# Patient Record
Sex: Female | Born: 1935 | Race: White | Hispanic: No | Smoking: Never smoker
Health system: Southern US, Community
[De-identification: ages and names within clinical notes are randomized; demographics above are authoritative.]

## PROBLEM LIST (undated history)

## (undated) DIAGNOSIS — N200 Calculus of kidney: Secondary | ICD-10-CM

## (undated) DIAGNOSIS — I10 Essential (primary) hypertension: Secondary | ICD-10-CM

## (undated) DIAGNOSIS — R739 Hyperglycemia, unspecified: Secondary | ICD-10-CM

## (undated) DIAGNOSIS — I509 Heart failure, unspecified: Secondary | ICD-10-CM

## (undated) DIAGNOSIS — I4819 Other persistent atrial fibrillation: Secondary | ICD-10-CM

---

## 2005-12-26 ENCOUNTER — Inpatient Hospital Stay (HOSPITAL_COMMUNITY): Admission: RE | Admit: 2005-12-26 | Discharge: 2006-01-23 | Payer: Self-pay | Admitting: General Surgery

## 2005-12-26 ENCOUNTER — Encounter (INDEPENDENT_AMBULATORY_CARE_PROVIDER_SITE_OTHER): Payer: Self-pay | Admitting: Specialist

## 2006-01-26 ENCOUNTER — Inpatient Hospital Stay (HOSPITAL_COMMUNITY): Admission: EM | Admit: 2006-01-26 | Discharge: 2006-02-05 | Payer: Self-pay | Admitting: *Deleted

## 2006-03-17 ENCOUNTER — Ambulatory Visit (HOSPITAL_COMMUNITY): Admission: RE | Admit: 2006-03-17 | Discharge: 2006-03-17 | Payer: Self-pay | Admitting: General Surgery

## 2006-06-09 IMAGING — CR DG ABDOMEN ACUTE W/ 1V CHEST
4 series · 4 of 4 positions shown · non-contrast
Comparison: none

CLINICAL DATA: Abdominal pain and nausea.  Reportedly status post laparoscopic Nissen fundoplication.  Has a drain.
ACUTE ABDOMINAL SERIES WITH CHEST ? 4 VIEWS ? 01/26/06:

[w chest pa]
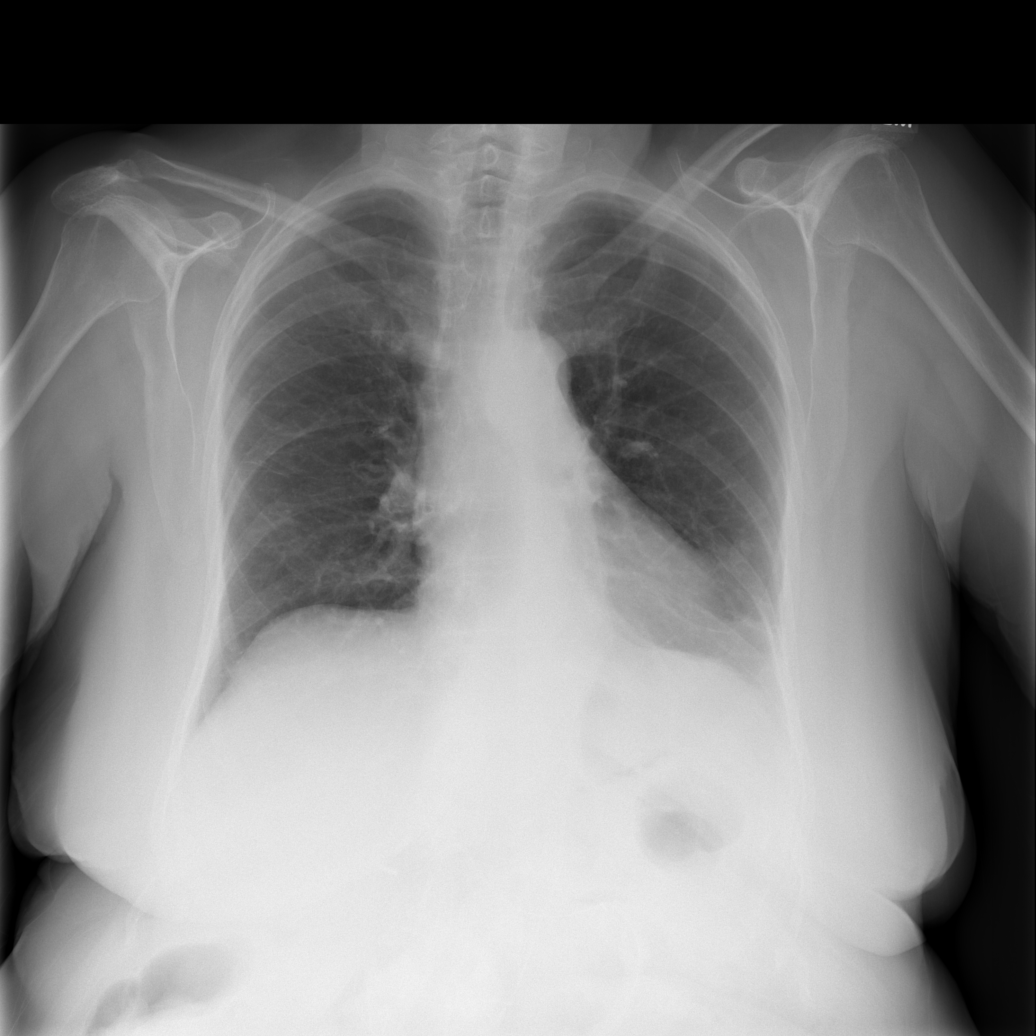

[w abdomen upright *]
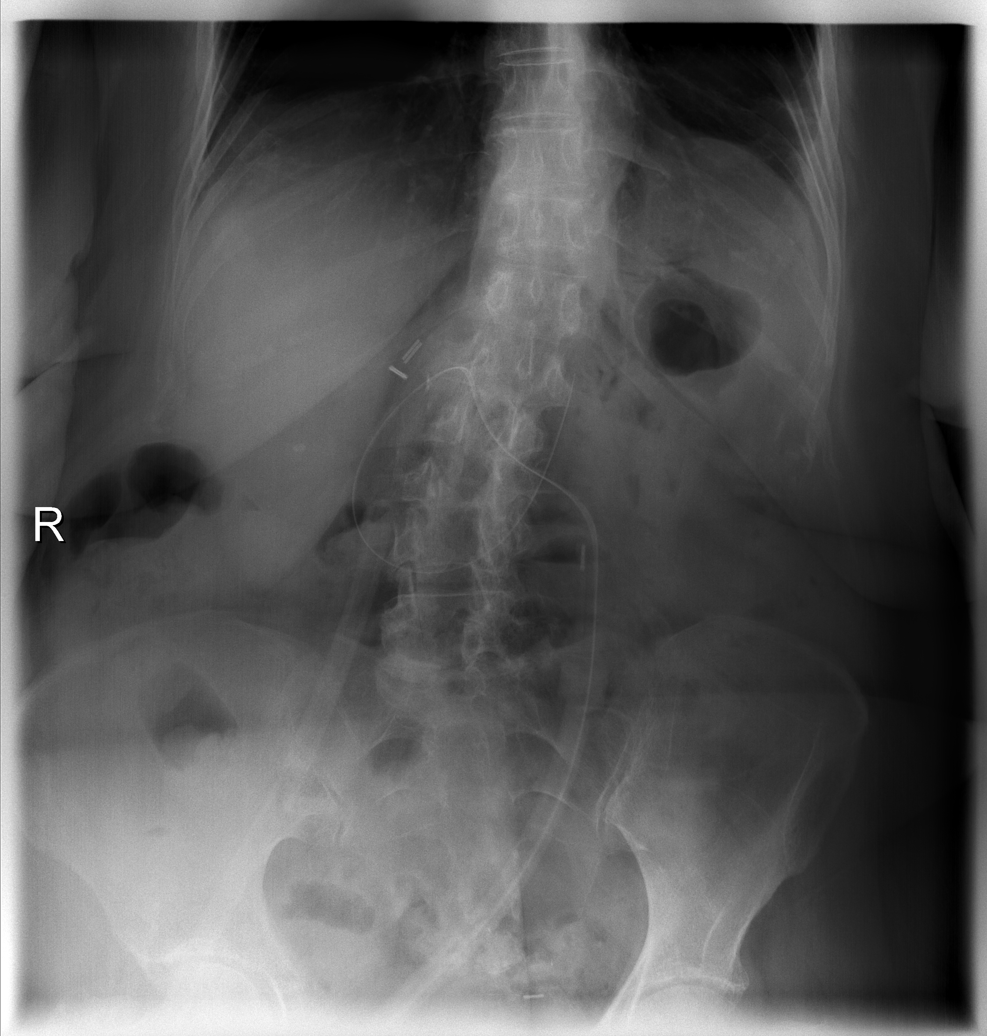

[t abdomen supine (1 of 2)]
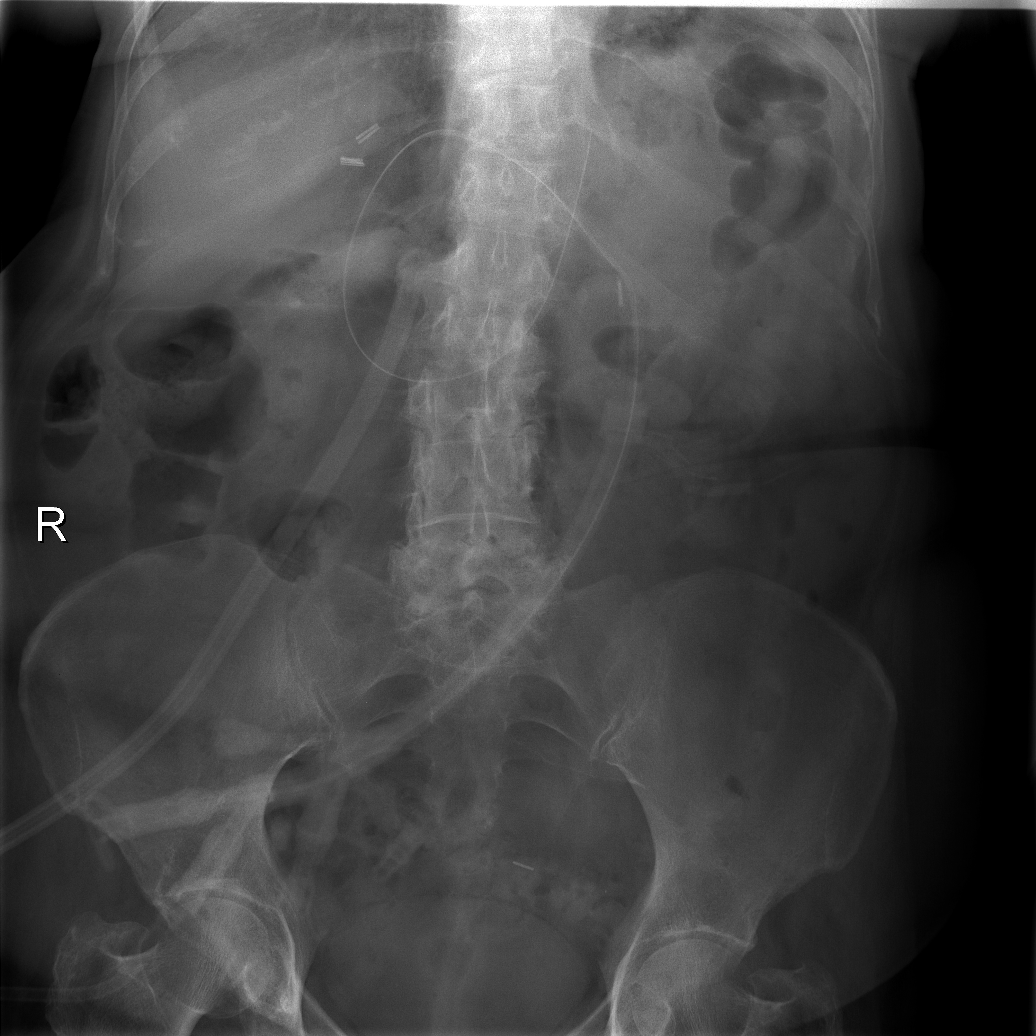

[t abdomen supine (2 of 2)]
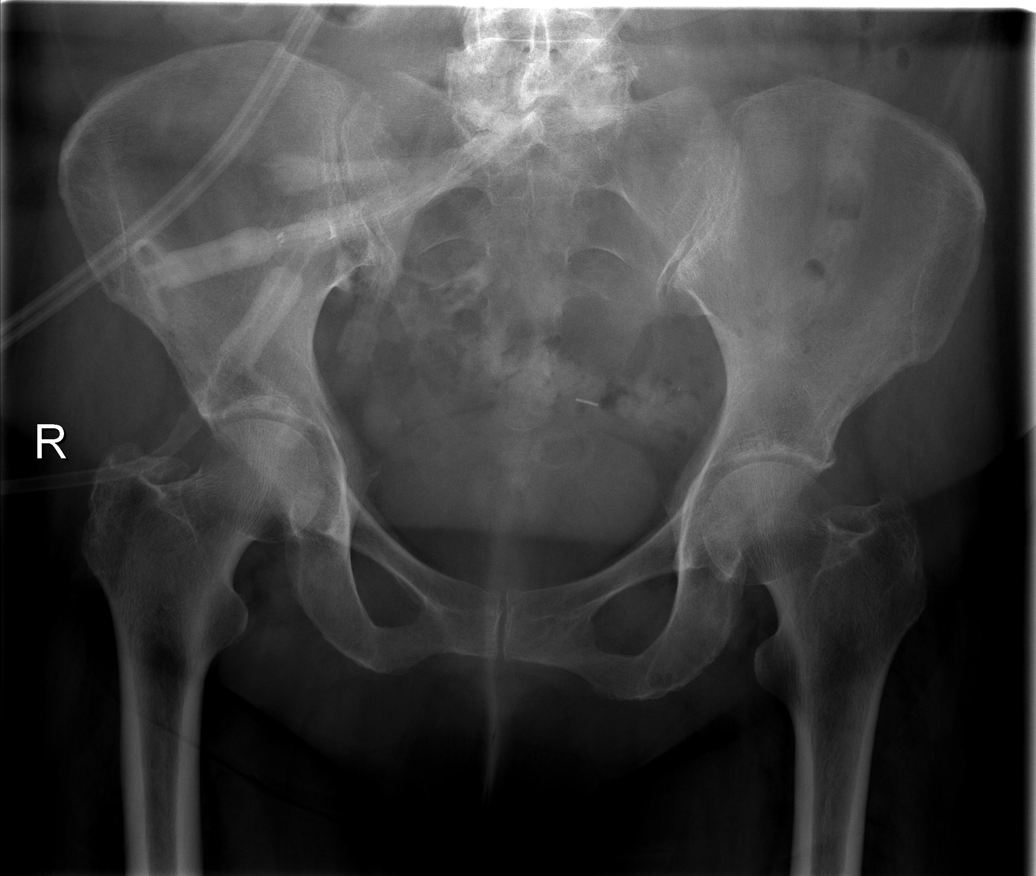

[4 of 4 positions shown; findings below may reference images not displayed]

FINDINGS: There is minimal subsegmental atelectasis versus scarring at the left base.  There is soft tissue prominence just above the diaphragm and may represent the fundoplication.  The bowel gas pattern is unremarkable.  No free intraperitoneal air.  A radiopaque drain is noted in the epigastrium.  There are metallic clips in the medial right upper quadrant and in the pelvis.  A right kidney stone is noted.
IMPRESSION: Unremarkable for acute chest or abdominal disease.

## 2006-06-11 IMAGING — XA IR PERC PLACEMENT GASTROSTOMY
1 series · 5 of 5 positions shown · non-contrast
Comparison: none

CLINICAL DATA: 78 year-old female, hypokalemia and long term gastric and jejunal tube feeds. There is persistent leakage of gastric contents at the gastrostomy skin site.  The jejunal tube is also coiled in the stomach.
PERCUTANEOUS EXCHANGE OF A GASTROJEJUNOSTOMY FEEDING TUBE:
Radiologist:  Dr. Ares.
Guidance:  Fluoroscopic.
Complications:  No immediate complications.
Procedure/Findings: The existing 22 French gastrostomy and the inner conversion GJ tube were both removed over a glidewire.  Access was remanipulated into the duodenum with a Kumpe catheter and a stiff glidewire.  Over the glidewire access, a new 26 French gastrojejunostomy feeding tube was advanced with the jejunal port only into the third portion of the duodenum.  This could not be advanced further because of the patient?s stomach redundancy.  Contrast injection confirms both the gastric and duodenal positions of the GJ tube.  The catheter balloon tip was inflated with 18 cc of saline and retracted against the abdominal wall.

[Series 1000: run · 5 of 5 slices shown]
[im 1/5]
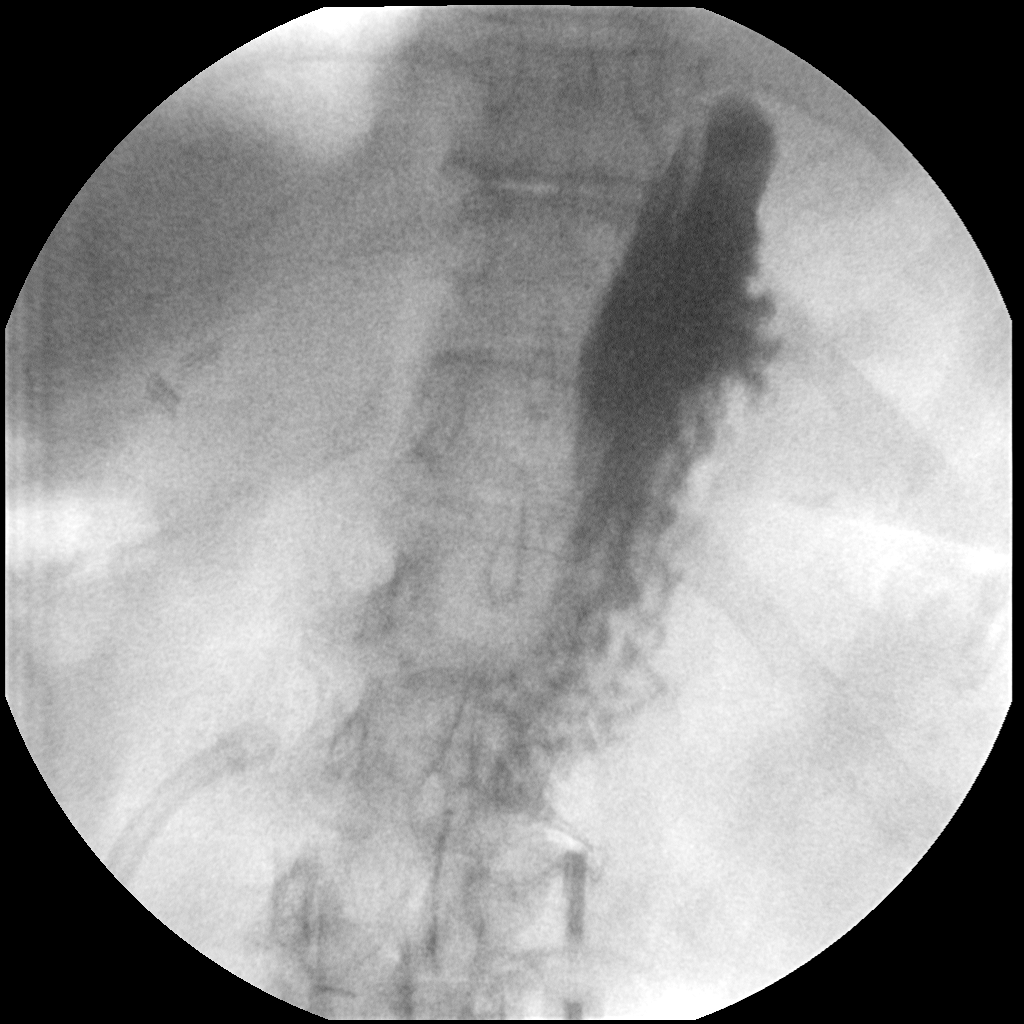
[im 2/5]
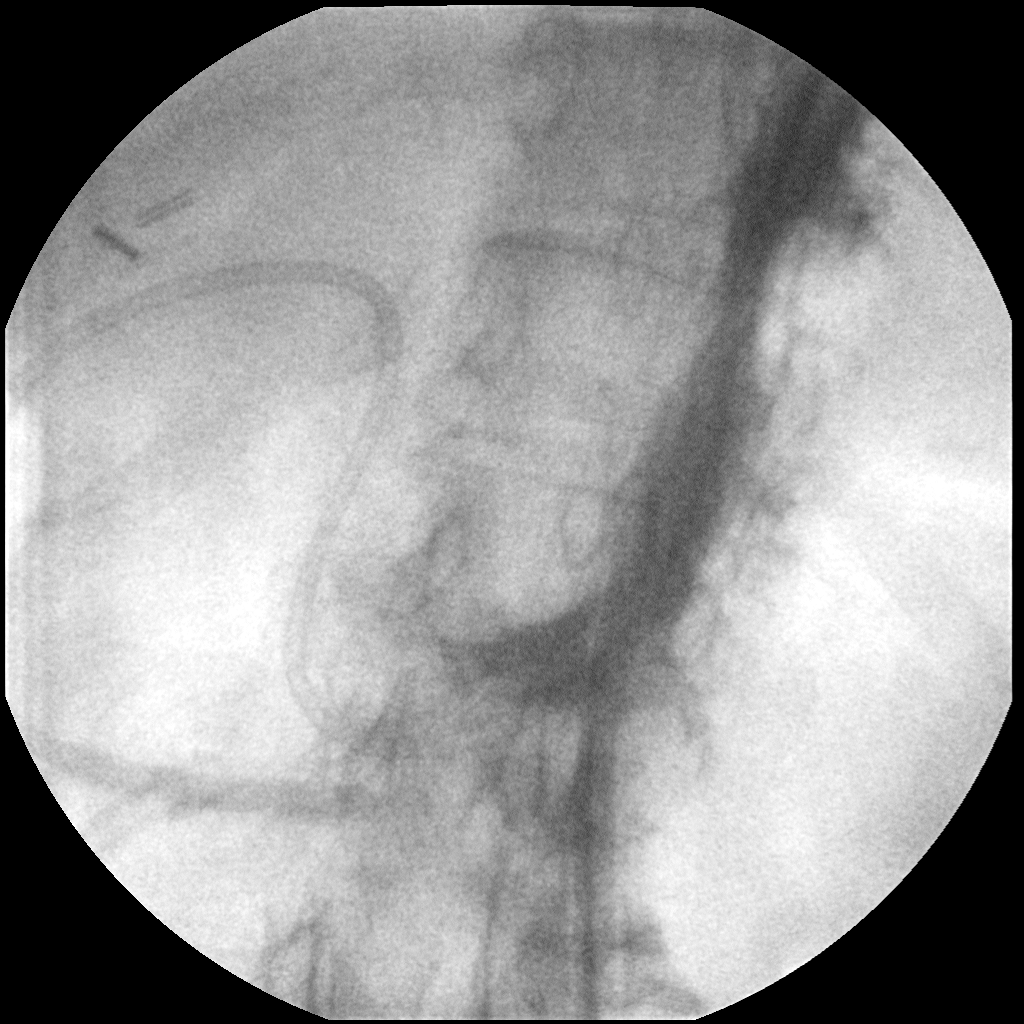
[im 3/5]
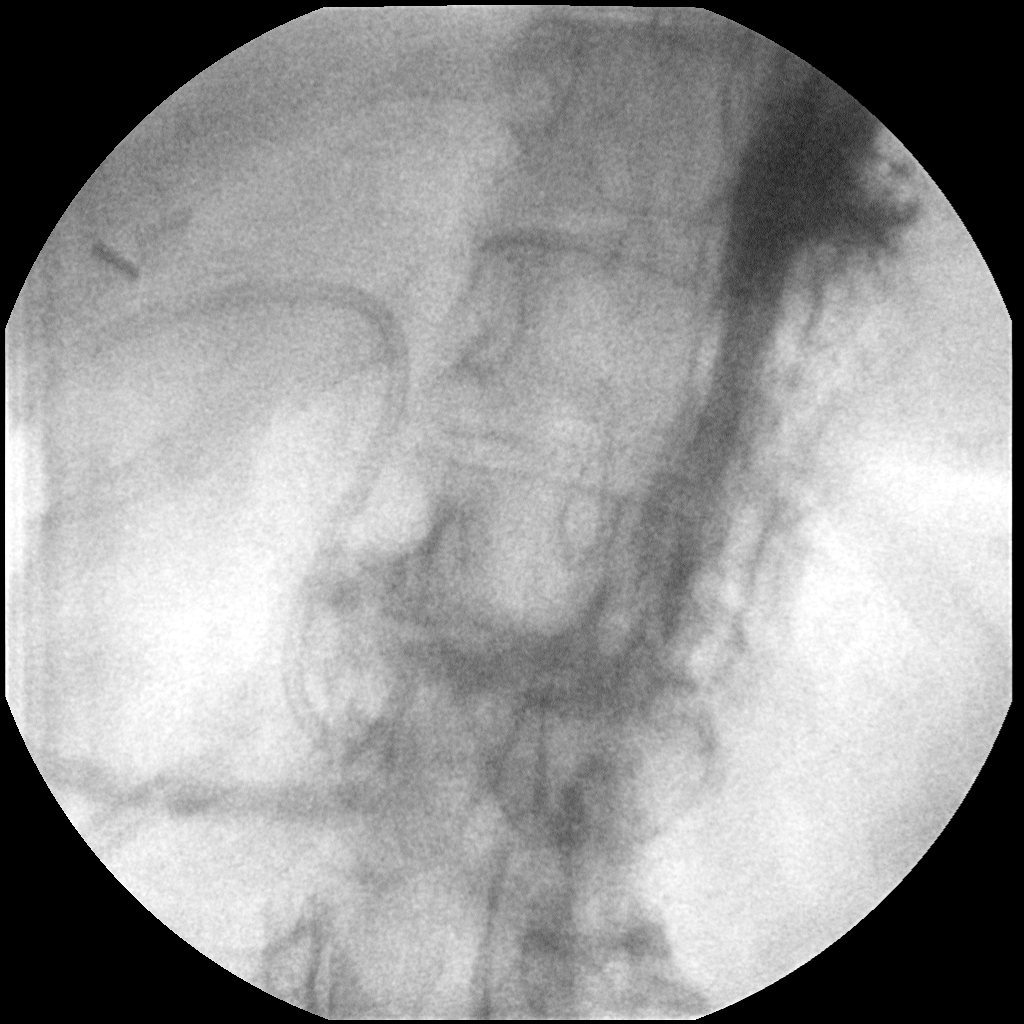
[im 4/5]
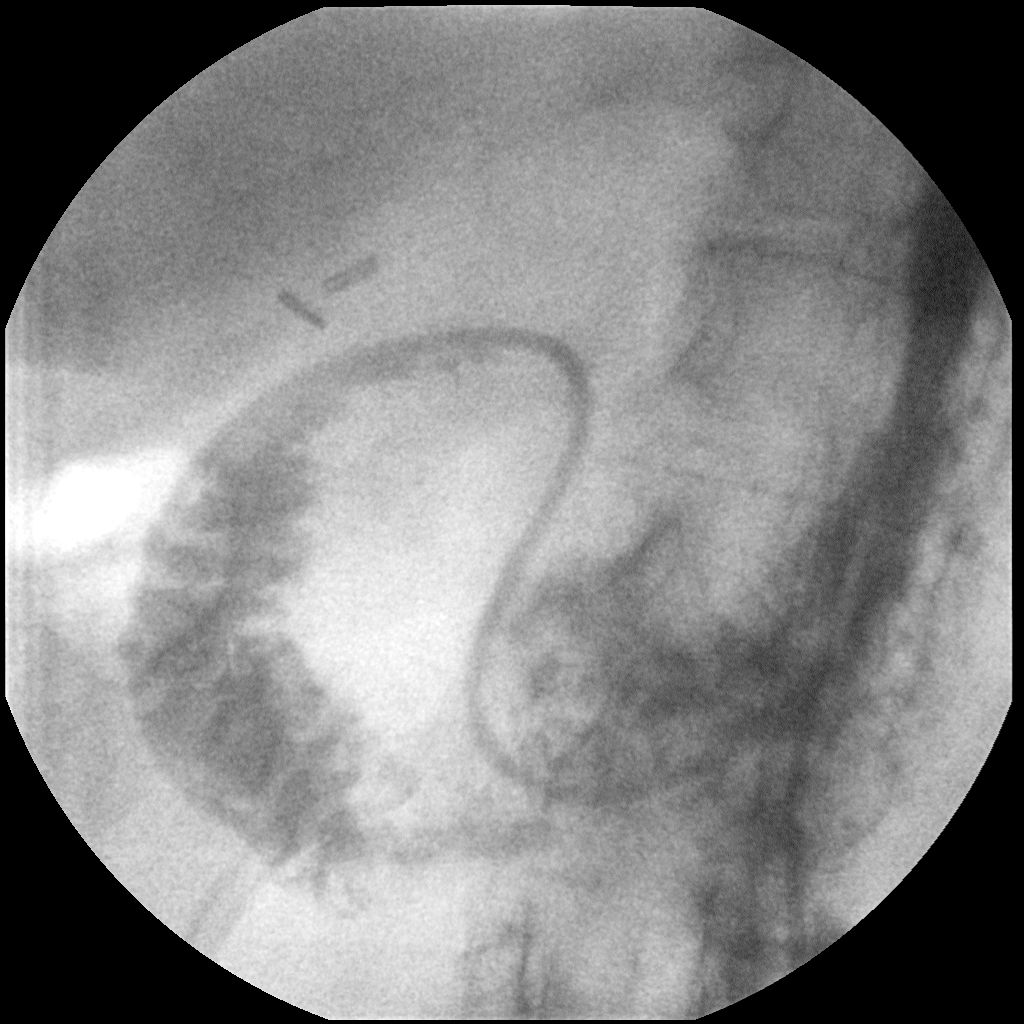
[im 5/5]
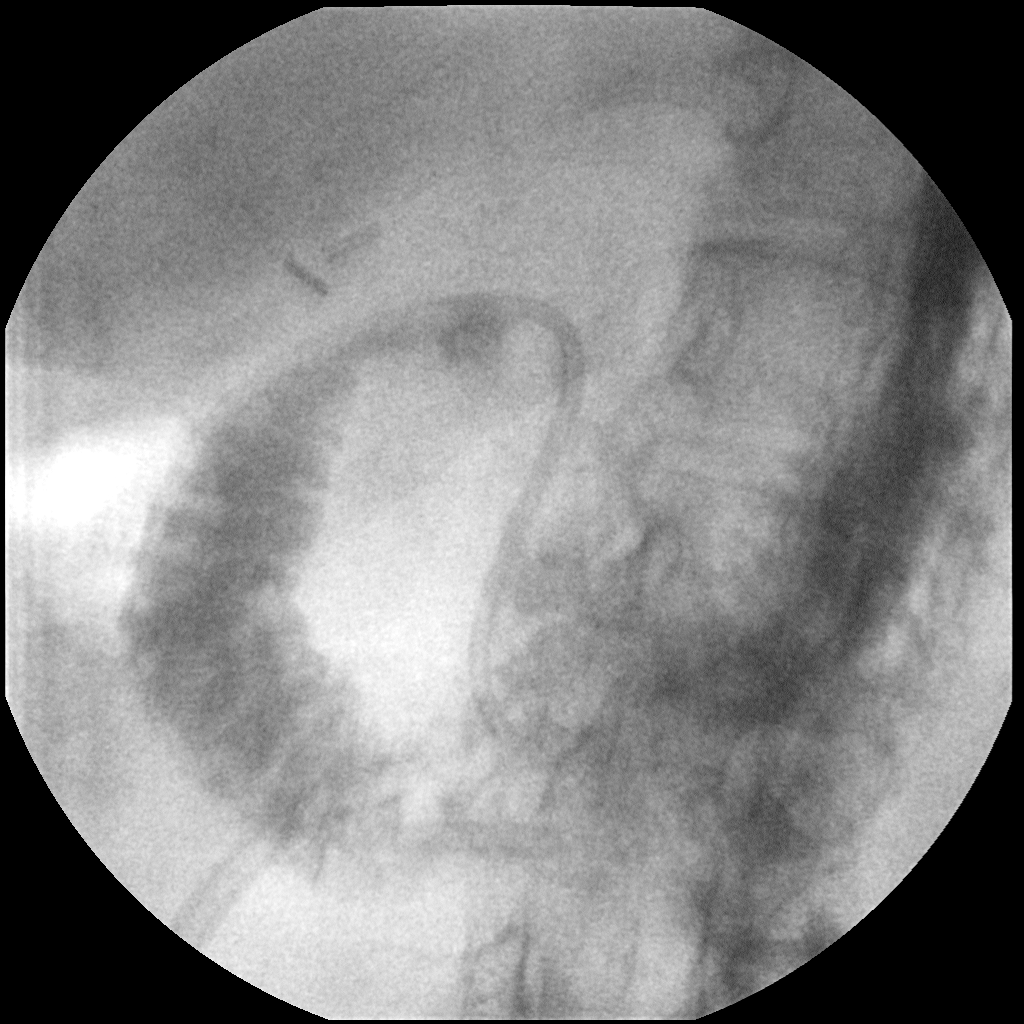

[5 of 5 positions shown; findings below may reference images not displayed]

IMPRESSION: Exchange and upsize of the GJ tube to a new 26 French gastrojejunostomy tube. The jejunal port could only be advanced as far as the third portion of the duodenum related to stomach redundancy.

## 2011-04-05 NOTE — Discharge Summary (Signed)
Samantha [name], Samantha [name]                  ACCOUNT NO.:  [id number]   MEDICAL RECORD NO.:  [id number]          PATIENT TYPE:  INP   LOCATION:  1619                         FACILITY:  [facility]   PHYSICIAN:  Adolph Pollack, M.D.DATE OF BIRTH:  [birth year]   DATE OF ADMISSION:  12/26/2005  DATE OF DISCHARGE:  01/23/2006                                 DISCHARGE SUMMARY   PRINCIPAL DISCHARGE DIAGNOSES:  1.  Type 4 hiatal hernia.  2.  Early recurrent hiatal hernia secondary to suture disruption.  3.  Hypertension.  4.  Delayed gastric emptying.  5.  Hypovolemia.  6.  Malnutrition.  7.  Blood loss anemia.  8.  Hypokalemia.  9.  Wound infection.  10. Urinary retention.  11. Abdominal wall rash.  12. Deconditioned state.  13. Open wound.   REASON FOR ADMISSION:  Ms. Wellman is a 77 year old female who has had a large  hiatal hernia, she states, for 17 years.  She had become symptomatic with it  and had what appeared to be a transient volvulus/obstruction.  She was  subsequently admitted to the hospital for repair.   HOSPITAL COURSE:  She underwent a laparoscopic hiatal hernia repair and  Nissen fundoplication on December 26, 2005.  I did an upper GI series to  check the status of the repair on December 27, 2005 and it demonstrated still  some intrathoracic stomach with slower emptying.  She, subsequently, was in  no acute distress and taken back to the operating room on December 28, 2005  where I did a laparoscopic converted to an open procedure, and there was  noted to have a disruption of 2 of the sutures.  The wrap had also appeared  to have slid down in the stomach.  She did not have an angle of His, so it  was very difficult to identify the gastroesophageal junction.  Intraoperatively, at that time, I performed an upper endoscopy that allowed  me to identify the GE junction.  She had a shortened esophagus.  I was able  to get the distal esophagus back into abdominal cavity and then  reclosed the  hiatus.  I was not able to perform a fundoplication, however, because not  enough esophagus was in the abdominal cavity.  I subsequently performed a  gastrostomy at that time.  Postoperatively, the second time,  the  gastrostomy was kept on drainage.  She had some postoperative acute blood  loss anemia and this stabilized out fairly well.  Her hemoglobin went down  to 9.8 by her fourth overall postoperative day and then 9.4 on the postop  day 7.  She had some difficulty with hypokalemia and this had to be  corrected.  I try to clamp the G tube  and started venting it every 6 hours  waiting for the ileus to resolve.  We continued to have to aggressively  treat her hypokalemia.  She had more and more drainage around her G tube and  that is when we noticed that the wound was red, and I opened this up and  there was gastric-type  drainage.  It appeared that the drainage had backed  up around the G tube and then gone into the wound.  I went ahead and hooked  a G tube up to gravity, had 400 mL of drainage immediately and this was with  her eighth postoperative day.  I went ahead and did a gastrostomy tube  injection on that day, which showed good position of the tube without  obstruction, but there was significant reflux and essentially very poor if  any emptying.  Reglan was started.   Hypokalemia persisted and we continued to treat this aggressively.  She was  started on TNA through a central line.  We started dressing changes and  continue to clean the wound.  She was on intravenous antibiotics (Zosyn).  I  left the Foley in.  I repeated an upper GI on January 07, 2006, which again  showed minimal to no gastric emptying.  We continued the G tube to drainage.  We continued to Reglan, antibiotics and wound care.  I started her on a  Procrit protocol for her blood loss anemia.  She then had a very slow, but  progressive postoperative course.  She was given to p.r.n.  Valium as  she  began having a restless leg type syndrome.  I tried her on a little bit of a  full liquid diet.  However, she did not tolerate this well and had  increasing G tube outputs, thus continued the TNA and placed the G tube back  to gravity drainage.  Eventually, I removed the G tube and converted to a  gastrostomy/jejunostomy tube with a tip of the jejunostomy portionto the  ligament of Treitz.  Enteral feeds were started.  A wound VAC was placed.  I  started slow and enteral feeds, and she tolerated things fairly well.  We  added free water and then started on some G tube clamping trials, and  unclamped it about every 4-6 hours as needed.  She did have some diarrhea  and some nausea, and thus we decreased the tube feeding rate and she  tolerated this better.  We slowly were able to increase the right idea in  and that she was able to tolerate this better.  She had a wound VAC on and  was tolerating this well.  She was tolerating her tube feedings.  The G tube  was putting about 300 mL a shift with intermittent drainage.  Bowels were  moving, passing some gas and we felt she was able to be discharged on January 23, 2006.   DISPOSITION:  Discharged to home on January 23, 2006.  She has a wound VAC on.  She will have home health nursing arrange to help her with her tube  feedings.  She will be on nocturnal tube feedings, as well as free water  replacement.  She was given a Lortab elixir to place through the jejunostomy  port for pain, as well as Phenergan suppositories and a Protonix suspension.  She is going to follow up in my office with me in 1 week.      Adolph Pollack, M.D.  Electronically Signed     TJR/MEDQ  D:  02/24/2006  T:  02/24/2006  Job:  [id number]

## 2011-04-05 NOTE — Op Note (Signed)
NAMEDOTSIE, GILLETTE                  ACCOUNT NO.:  [id number]   MEDICAL RECORD NO.:  [id number]          PATIENT TYPE:  INP   LOCATION:  1619                         FACILITY:  [facility]   PHYSICIAN:  Adolph Pollack, M.D.DATE OF BIRTH:  02/18/1927   DATE OF PROCEDURE:  12/28/2005  DATE OF DISCHARGE:                                 OPERATIVE REPORT   PREOPERATIVE DIAGNOSIS:  Recurrent hiatal hernia.   POSTOPERATIVE DIAGNOSIS:  Recurrent hiatal hernia.   PROCEDURE:  Repair of recurrent hiatal hernia and gastrostomy.   SURGEON:  Dr. Abbey Chatters.   ASSISTANT:  Dr. Lebron Conners and Dr. Cyndia Bent   ANESTHESIA:  General.   INDICATIONS:  This 77 year old female underwent a laparoscopic repair of a  type 4 hiatal hernia with a Nissen fundoplication 2 days ago.  Routine  postop day #1, upper GI series was suggestive of a recurrent herniation and  confirmed by CT scan as well as potential slippage of the fundoplication.  She was doing fairly well, but she is brought to the operating room today  for repair of recurrent hernia.   TECHNIQUE:  She is brought to the operating room, placed supine on the  operating table, and general anesthetic was administered.  An oral gastric  tube was placed, and the abdominal wall was sterilely prepped and draped.  Dressings had been removed, and the previous supraumbilical incision was  opened up and the fascial stitch removed.  A Hassan trocar was introduced  into the peritoneal cavity and pneumoperitoneum created by insufflation of  CO2 gas.  Under laparoscopic vision, I replaced the right upper quadrant  trocars and the left upper quadrant trocar, and 10/11 mm trocars were  inserted into the abdominal cavity.  Liver retractor was placed through a  previous 5 mm incision in the epigastrium and the left lobe of the liver  retracted anteriorly.  I was immediately was able to note that part of the  stomach had herniated through an anterior portion  of the hiatus, indicating  some disruption of the repair, as this area did not exist after the surgery  was completed 2 days ago.  I reduced the stomach.  I then examined the  crural repair and noticed that only 4 of the 6 sutures were still intact.  I  subsequently identified the fundoplication that cut the sutures and took the  wrap down.  In looking at the x-ray and looking at the stomach, I noted that  she had no angle of His that I could appreciate and, in fact, the wrap had  probably been around the proximal portion of the stomach.  She appeared at  this time to have somewhat of a short esophagus, and I could not seem to  free up the proximal stomach in the esophagus, indicating that indeed this  was a little bit of an aberrant anatomical finding.   At this time, I performed upper endoscopy to indeed identify that point.  The esophagus appeared to be a little bit dilated.  I found the  gastroesophageal junction and noted it was at the  level of the hiatus at  this time and, indeed, there was part of the stomach without the angle of  His, this basically looking like part of the esophagus.  The stomach was  more of a tubular nature proximally.  I then evacuated the air from the  stomach and removed the endoscope.   I removed the epigastric trocar and made a larger incision and put a hand  assist device in.  I introduced my left hand into the abdominal cavity,  tried to reduce some of the esophagus back in the abdominal cavity which I  could do, but this was still difficult to determine if I could mobilize more  of the stomach to bring it down off the esophagus.  Because of this, I  removed the hand assist device and converted to an open procedure by making  an upper midline incision through all layers and using a self-retaining  retractor for exposure.  I exposed the gastroesophageal junction and noticed  again that there was no angle of His.  I was able to mobilize the esophagus   maybe 1-2 cm just below the diaphragm but not enough to perform a wrap  fundoplication.  I then replaced 3 more pledgeted sutures posteriorly in the  hiatus to close it fairly tightly.  I then chose a point on the body of the  stomach to pull a gastrostomy tube and.  A stab incision was made in the  left upper quadrant, and a size 24-French Foley 5 mL balloon was inserted  into the abdominal cavity.  The balloon was tested and was intact.  Two  pursestring sutures of 2-0 silk were placed in the body of the stomach, and  then a gastrotomy was performed.  The Foley catheter was placed into the  stomach, and a pursestring suture was tied down.  A 4 quadrant gastropexy  was then performed, and this provided some inferior traction on the stomach,  keeping it from going back up toward the hiatus.  The gastrostomy tube was  anchored to the skin with 3-0 nylon suture.   The area was irrigated, and no bleeding was noted.  There was subsequently a  missing specific type of retractor.  I did go ahead and close the fascia  with a #1 PDS, and an x-ray was performed.  There is some suspicion whether  there was an inaccurate count to starting the case.  There was no evidence  of retained a retractor by way of fluoroscopy of the abdominal cavity.  The  OR staff felt that the initial starting count was likely incorrect.   Following this, the subcutaneous tissue was irrigated.  All skin incisions  were closed with staples, and sterile dressings were applied.   She tolerated the procedure without apparent complications, and she was  taken to the recovery room in satisfactory condition.      Adolph Pollack, M.D.  Electronically Signed     TJR/MEDQ  D:  12/28/2005  T:  12/30/2005  Job:  [id number]

## 2011-04-05 NOTE — Discharge Summary (Signed)
NAMEKINGA, Samantha [name]                  ACCOUNT NO.:  [id number]   MEDICAL RECORD NO.:  [id number]          PATIENT TYPE:  INP   LOCATION:  1619                         FACILITY:  [facility]   PHYSICIAN:  Adolph Pollack, M.D.DATE OF BIRTH:  12-17-1926   DATE OF ADMISSION:  01/26/2006  DATE OF DISCHARGE:  02/05/2006                                 DISCHARGE SUMMARY   PRINCIPAL DIAGNOSIS:  Dislodged gastrostomy/jejunostomy tube.   SECONDARY DIAGNOSES:  1.  Ileus.  2.  Hypokalemia contributing to the ileus.  3.  Open abdominal wound.  4.  Malnutrition.   PROCEDURE:  Replacement of gastrostomy/jejunostomy tube.   REASON FOR ADMISSION:  [name] is a 77 year old female who had a prolonged  hospital course after hiatal hernia repair. She was sent home on tube  feedings by way of a combination gastrostomy/jejunostomy. She began having a  lot of discharge out through the gastrostomy and around the gastrostomy with  nausea and vomiting and was brought to the emergency department and found to  have profound hypokalemia with a potassium of 2.4. She was subsequently  admitted.   HOSPITAL COURSE:  She was admitted and hydrated. Radiologic study  demonstrated the gastrostomy/jejunostomy tube as malpositioned. I had this  subsequently repositioned the next day. The hypokalemia was corrected and  the ileus slowly resolved. We then began to be able to start low rate  jejunostomy feedings. Bowels started to move. She had some gastrostomy  output that was greenish-yellow and eventually lightened up. She still had  intermittent hypokalemia and I started replacing her potassium through the  tube. She had an open wound and the vac was on. She did have poor gastric  emptying and this was noted from her previous admission. We got a gastric  emptying scan which showed significant delayed emptying with about 82%  residual at two hours with greater than 30% being normal. This was discussed  with her and her  family. I did briefly place her on Procrit protocol for  some anemia.   By February 05, 2006, she was tolerating the jejunostomy feedings again at  night. She did not have much G-tube output. She was tolerating potassium  through the G-tube and was able to be discharged.   DISPOSITION:  Discharged home on February 05, 2006. We will continue to give  her potassium elixir through the tube and she was continued on her home  medicines. Ativan was given for anxiety. She is to get nighttime tube  feedings and then free water 300 mL by way of the J-tube port three times a  day.  Home health nursing will be arranged to help her take care of the  jejunostomy tube, for the vac change, dressing change and wound care. She is  to see me in five to seven days.      Adolph Pollack, M.D.  Electronically Signed     TJR/MEDQ  D:  03/07/2006  T:  03/08/2006  Job:  [id number]

## 2011-04-05 NOTE — H&P (Signed)
NAMEALVINA, [name]                  ACCOUNT NO.:  [id number]   MEDICAL RECORD NO.:  [id number]          PATIENT TYPE:  INP   LOCATION:  1619                         FACILITY:  [facility]   PHYSICIAN:  Anselm Pancoast. Weatherly, M.D.DATE OF BIRTH:  [birth year]   DATE OF ADMISSION:  01/26/2006  DATE OF DISCHARGE:                                HISTORY & PHYSICAL   CHIEF COMPLAINT:  Nausea, has gastrostomy tube, and is status post complex  hiatus hernia repair.   HISTORY:  Samantha [name] is a 77 year old overweight female, who was  hospitalized for approximately 30 days here at [facility] following a large  giant hiatus hernia that was originally operated on by Dr. Abbey Chatters, and  the necessity of a reoperation approximately 3 days later and a gastrostomy  tube; that was December 28, 2005.  She was discharged on January 23, 2006, with  a gastrostomy tube in place.  She has a wound VAC.  She is receiving tube  feeding through the jejunostomy limb of the triple lumen catheter, and the  gastrostomy tube has been left open in here while she was hospitalized  according to the daughter, who is doing most of the care.  The patient  really has not been able to tolerate the gastrostomy tube being clamped any  significant period of time.  She, last evening, had significant nausea and  anxiety, and the daughter, this morning, called and requested that she be  seen, and I recommended she come back to the emergency room for possibly  readmission.  The patient was discharged with the following instructions:  She was taking Phenergan 25 mg suppository for nausea, Protonix 20 mL daily,  milk of magnesia as needed, and they did give her milk of magnesia  yesterday, and then she has had diarrhea early this morning.  She was  instructed to take about 3 ounces of water 3 times a day plus liquids that  she tolerated, but the patient's daughter says that really they have not  been able to clamp the gastrostomy on the 2  days that she has been at home.  Supposedly, she was to see Dr. Abbey Chatters in the office in approximately 1  week and when she arrived back in the ER, electrolytes were obtained, and  her potassium was down to 2.4, and the patient was nauseous.  She was given  Zofran in the emergency room, and that has definitely quieted the nausea.  The gastrostomy tube was open, and she has about 200-300 mL of drainage in  the gastrostomy bag.  The daughter states that the gastrostomy drainage is  greenish where the tube feeding is not much of the actual drainage from the  gastrostomy as related to the tube feeding.  I cannot tell because of the  similarity of the colors of these substances.  I do not think she is  actually on any chronic medication for blood pressure, etc.  On looking over  the labs from her recent hospitalization, she had had significant problems  with hypokalemia previously, but the last potassium that I could  see  measured was __________at which time her potassium was 3.5, and the last  time that it was documented that she had a low potassium was back on  February 15, at which time her potassium was about 2.5.   Past medical history, etc., please refer to the old chart that is up here  with the patient.   PHYSICAL EXAMINATION:  VITAL SIGNS:  Her temperature was 98.7, blood  pressure 132/90.  Her pulse was first measured at 108.  Second, after an IV  started, her pulse was 80.  Respiration was 20.  She appears adequately  hydrated, kind of resting comfortably when I saw her.  Good breath sounds  bilaterally.  Her family was in attendance.  CARDIAC:  Normal sinus rhythm.  ABDOMEN:  She has a balloon bag that is the upper wound has constricted  nicely.  There is a jejunostomy feeding hooked up to the kind of combination  of feeding tube/gastrostomy tube, but the gastrostomy tube is open.  There  is fairly marked excoriation of the skin around the gastrostomy tube where  GI contents  have been leaking onto the skin.  She is not tender in the lower  abdomen, and I did not do a rectal exam.  She just have a soft bowel  movement that was not really frank diarrhea, but was sort of like the tube  feeding liquid stools.  I do not appreciate any acute tenderness in her  legs.   ADMISSION IMPRESSION:  1.  Hypokalemia secondary to gastrointestinal contents lost, probably from      basically gastrostomy tube __________ G-tube has been open for the last      2 days.  Her low potassium of 2.5, with intravenous potassium in the      emergency room is now 10.8 when she was transferred to the floor, and I      am going to run her IV 125 mL/hr and D5 half normal at 40 KCl per liter      and repeat a CBC and BMET in the morning.  I am going to let Dr.      Abbey Chatters decide whether he would like to do a CAT scan in the morning      or an upper GI to kind of further study her, and we will leave the G-      tube open until he has made a decision.  It appears that the Zofran is      working much better than the Phenergan that she was receiving at home      for the nausea, but obviously there is more going on than just      postoperative ileus causing the nausea.           ______________________________  Anselm Pancoast. Zachery Dakins, M.D.     WJW/MEDQ  D:  01/26/2006  T:  01/27/2006  Job:  [id number]   cc:   Adolph Pollack, M.D.  [address], [address]  [place]  [place] [address]

## 2011-04-05 NOTE — Op Note (Signed)
NAMEJERALDINE, PRIMEAU                  ACCOUNT NO.:  [id number]   MEDICAL RECORD NO.:  [id number]          PATIENT TYPE:  AMB   LOCATION:  DAY                          FACILITY:  [facility]   PHYSICIAN:  Adolph Pollack, M.D.DATE OF BIRTH:  28-Sep-1927   DATE OF PROCEDURE:  DATE OF DISCHARGE:                                 OPERATIVE REPORT   PREOPERATIVE DIAGNOSIS:  Type IV hiatal hernia.   POSTOPERATIVE DIAGNOSIS:  Type IV hiatal hernia.   PROCEDURE:  Laparoscopic hiatal hernia, Nissen fundoplication.   SURGEON:  Adolph Pollack, M.D.   ASSISTANT:  Ollen Gross. Carolynne Edouard, M.D.   ANESTHESIA:  General.   INDICATIONS:  Ms. Steveson is a 77 year old female who began having severe  nausea and vomiting New Year's Day and was admitted to [facility]. A  large hiatal hernia was noted as documented by scan and upper GI. She had a  known hiatal hernia roughly 17 years prior to that. She had some  gastroesophageal reflux disease with it but was treated with over-the-  counter Prilosec. Because of her symptomatic hernia. She now presents for  repair.   TECHNIQUE:  She was seen in the holding area and brought to the operating  room, placed supine on the operating table, and a general anesthetic was  administered. A Foley catheter was placed in the bladder. The abdominal wall  was sterilely prepped and draped. Superior to the umbilicus, a small  incision was made in the midline through the skin, subcutaneous tissue, and  fascia. The peritoneal cavity was then entered under vision. A purse string  suture of zero Vicryl was placed around the fascial edges. A Hassan trocar  was introduced into the peritoneal cavity and pneumoperitoneum was created  by insufflation of CO2 gas.   Following this, she was placed in reverse Trendelenburg position. I then  placed two 10 mm trocars through right upper quadrant incisions and one 10  mm trocar through a left upper quadrant incision.  A 5 mm Nathanson liver  retractor was placed into the abdominal cavity. The left lobe liver was  retracted anteriorly exposing a large hiatus. Now the stomach was in the  abdominal cavity. I then began reducing the stomach and orienting it.  The  stomach had taken about a 90 degrees twist in the chest. I made sure that  the greater curvature was in proper orientation.   I incised some thin gastrohepatic ligament until I  identify the right crus.  There is a very large hernia sac that was noted and it was adherent to the  right pleura into the pericardium. Using the harmonic scalpel, I began to  incise the hernia sac and reduce it. I went from the right side to the  midline into the left side, carefully identifying hernia sac, stomach and  esophagus, as well as pleura and pericardium. I continued in this fashion  for approximately 1-1/2 to 2 hours reducing the hernia and then excising the  hernia sac first. The largest portion of the sac was actually on the right  side attached to the  right crus and using harmonic scalpel I was able to  excise this and removed it. The sac in the left side was also excised using  the Harmonic scalpel.   Following this, I then mobilized some short gastric vessels from the side of  the fundus. I got to a point where I could bluntly create a retroesophageal  window and a Penrose drain was placed in the retroesophageal window and the  GE junction was retracted.  Posterior portions of the sac were then excised  using the Harmonic scalpel. The posterior vagus nerve was identified and  kept with the esophagus. No gastric or esophageal injuries were noted.   I then approached the repair of the hiatal hernia. The left and right crura  were identified. Using felt pledgeted sutures, I closed the hiatal defect  with six nonabsorbable felt pledgeted sutures in a simple type fashion. I  then passed the fundus through the retroesophageal window. A size 56 lighted  bougie was then passed down  the esophagus into the stomach. A 360 degree  fundoplication was then performed, measuring approximately 2 cm in length.  The first two sutures incorporated the left leaf of the wrap, a small amount  of the esophagus, and the right leaf of the wrap. The most distal suture  incorporated the left and right leaves of the wrap. Clips were used to mark  the sutures.   The dilator was removed and intact. The fundoplication was floppy and under  no tension. There was no active bleeding at this time. I did noticed that  part of the spleen appeared to be devascularized just a little bit in the  medial portion. I then removed the liver retractor. She was taken out of the  reverse Trendelenburg position. The remaining trocars were removed and a  pneumoperitoneum was released. The supraumbilical fascial defect was closed  by tightening up and tying down the pursestring suture. The skin incisions  were then closed with four Monocryl subcuticular stitches followed by Steri-  Strips and sterile dressings.  She tolerated procedure well without apparent  complications. She was subsequently taken to recovery room  extubated and in  satisfactory condition.      Adolph Pollack, M.D.  Electronically Signed     TJR/MEDQ  D:  12/26/2005  T:  12/27/2005  Job:  [id number]   cc:   Lynann Bologna  Fax: [phone]   Dr. Keturah Barre, [place]

## 2011-11-26 DIAGNOSIS — H3530 Unspecified macular degeneration: Secondary | ICD-10-CM | POA: Diagnosis not present

## 2011-12-18 DIAGNOSIS — H353290 Exudative age-related macular degeneration, unspecified eye, stage unspecified: Secondary | ICD-10-CM | POA: Diagnosis not present

## 2011-12-18 DIAGNOSIS — H35059 Retinal neovascularization, unspecified, unspecified eye: Secondary | ICD-10-CM | POA: Diagnosis not present

## 2012-01-27 DIAGNOSIS — H35059 Retinal neovascularization, unspecified, unspecified eye: Secondary | ICD-10-CM | POA: Diagnosis not present

## 2012-01-27 DIAGNOSIS — H353290 Exudative age-related macular degeneration, unspecified eye, stage unspecified: Secondary | ICD-10-CM | POA: Diagnosis not present

## 2012-03-04 DIAGNOSIS — H353290 Exudative age-related macular degeneration, unspecified eye, stage unspecified: Secondary | ICD-10-CM | POA: Diagnosis not present

## 2012-03-04 DIAGNOSIS — H35359 Cystoid macular degeneration, unspecified eye: Secondary | ICD-10-CM | POA: Diagnosis not present

## 2012-03-04 DIAGNOSIS — H353190 Nonexudative age-related macular degeneration, unspecified eye, stage unspecified: Secondary | ICD-10-CM | POA: Diagnosis not present

## 2012-04-15 DIAGNOSIS — H353290 Exudative age-related macular degeneration, unspecified eye, stage unspecified: Secondary | ICD-10-CM | POA: Diagnosis not present

## 2012-04-15 DIAGNOSIS — H35059 Retinal neovascularization, unspecified, unspecified eye: Secondary | ICD-10-CM | POA: Diagnosis not present

## 2012-04-30 DIAGNOSIS — Z79899 Other long term (current) drug therapy: Secondary | ICD-10-CM | POA: Diagnosis not present

## 2012-04-30 DIAGNOSIS — E559 Vitamin D deficiency, unspecified: Secondary | ICD-10-CM | POA: Diagnosis not present

## 2012-04-30 DIAGNOSIS — M810 Age-related osteoporosis without current pathological fracture: Secondary | ICD-10-CM | POA: Diagnosis not present

## 2012-04-30 DIAGNOSIS — I10 Essential (primary) hypertension: Secondary | ICD-10-CM | POA: Diagnosis not present

## 2012-04-30 DIAGNOSIS — Z23 Encounter for immunization: Secondary | ICD-10-CM | POA: Diagnosis not present

## 2012-04-30 DIAGNOSIS — E782 Mixed hyperlipidemia: Secondary | ICD-10-CM | POA: Diagnosis not present

## 2012-04-30 DIAGNOSIS — R7309 Other abnormal glucose: Secondary | ICD-10-CM | POA: Diagnosis not present

## 2012-05-25 DIAGNOSIS — H35359 Cystoid macular degeneration, unspecified eye: Secondary | ICD-10-CM | POA: Diagnosis not present

## 2012-05-25 DIAGNOSIS — H353290 Exudative age-related macular degeneration, unspecified eye, stage unspecified: Secondary | ICD-10-CM | POA: Diagnosis not present

## 2012-07-01 DIAGNOSIS — H353290 Exudative age-related macular degeneration, unspecified eye, stage unspecified: Secondary | ICD-10-CM | POA: Diagnosis not present

## 2012-08-05 DIAGNOSIS — H35059 Retinal neovascularization, unspecified, unspecified eye: Secondary | ICD-10-CM | POA: Diagnosis not present

## 2012-08-05 DIAGNOSIS — H353290 Exudative age-related macular degeneration, unspecified eye, stage unspecified: Secondary | ICD-10-CM | POA: Diagnosis not present

## 2012-09-08 DIAGNOSIS — E7800 Pure hypercholesterolemia, unspecified: Secondary | ICD-10-CM | POA: Diagnosis not present

## 2012-09-09 DIAGNOSIS — H35059 Retinal neovascularization, unspecified, unspecified eye: Secondary | ICD-10-CM | POA: Diagnosis not present

## 2012-09-09 DIAGNOSIS — H353290 Exudative age-related macular degeneration, unspecified eye, stage unspecified: Secondary | ICD-10-CM | POA: Diagnosis not present

## 2012-10-19 DIAGNOSIS — H35359 Cystoid macular degeneration, unspecified eye: Secondary | ICD-10-CM | POA: Diagnosis not present

## 2012-10-19 DIAGNOSIS — H35059 Retinal neovascularization, unspecified, unspecified eye: Secondary | ICD-10-CM | POA: Diagnosis not present

## 2012-10-19 DIAGNOSIS — H353290 Exudative age-related macular degeneration, unspecified eye, stage unspecified: Secondary | ICD-10-CM | POA: Diagnosis not present

## 2012-11-23 DIAGNOSIS — H353290 Exudative age-related macular degeneration, unspecified eye, stage unspecified: Secondary | ICD-10-CM | POA: Diagnosis not present

## 2012-11-23 DIAGNOSIS — H35059 Retinal neovascularization, unspecified, unspecified eye: Secondary | ICD-10-CM | POA: Diagnosis not present

## 2012-12-18 DIAGNOSIS — H3530 Unspecified macular degeneration: Secondary | ICD-10-CM | POA: Diagnosis not present

## 2013-01-04 DIAGNOSIS — H353290 Exudative age-related macular degeneration, unspecified eye, stage unspecified: Secondary | ICD-10-CM | POA: Diagnosis not present

## 2013-01-04 DIAGNOSIS — H35059 Retinal neovascularization, unspecified, unspecified eye: Secondary | ICD-10-CM | POA: Diagnosis not present

## 2013-01-04 DIAGNOSIS — H35359 Cystoid macular degeneration, unspecified eye: Secondary | ICD-10-CM | POA: Diagnosis not present

## 2013-02-10 DIAGNOSIS — H353290 Exudative age-related macular degeneration, unspecified eye, stage unspecified: Secondary | ICD-10-CM | POA: Diagnosis not present

## 2013-02-10 DIAGNOSIS — H35059 Retinal neovascularization, unspecified, unspecified eye: Secondary | ICD-10-CM | POA: Diagnosis not present

## 2013-03-24 DIAGNOSIS — H353290 Exudative age-related macular degeneration, unspecified eye, stage unspecified: Secondary | ICD-10-CM | POA: Diagnosis not present

## 2013-03-24 DIAGNOSIS — H35059 Retinal neovascularization, unspecified, unspecified eye: Secondary | ICD-10-CM | POA: Diagnosis not present

## 2013-04-28 DIAGNOSIS — H353290 Exudative age-related macular degeneration, unspecified eye, stage unspecified: Secondary | ICD-10-CM | POA: Diagnosis not present

## 2013-04-28 DIAGNOSIS — H353190 Nonexudative age-related macular degeneration, unspecified eye, stage unspecified: Secondary | ICD-10-CM | POA: Diagnosis not present

## 2013-05-12 DIAGNOSIS — L821 Other seborrheic keratosis: Secondary | ICD-10-CM | POA: Diagnosis not present

## 2013-05-12 DIAGNOSIS — L820 Inflamed seborrheic keratosis: Secondary | ICD-10-CM | POA: Diagnosis not present

## 2013-06-02 DIAGNOSIS — H353290 Exudative age-related macular degeneration, unspecified eye, stage unspecified: Secondary | ICD-10-CM | POA: Diagnosis not present

## 2013-06-02 DIAGNOSIS — H35059 Retinal neovascularization, unspecified, unspecified eye: Secondary | ICD-10-CM | POA: Diagnosis not present

## 2013-07-07 DIAGNOSIS — M810 Age-related osteoporosis without current pathological fracture: Secondary | ICD-10-CM | POA: Diagnosis not present

## 2013-07-07 DIAGNOSIS — I10 Essential (primary) hypertension: Secondary | ICD-10-CM | POA: Diagnosis not present

## 2013-07-14 DIAGNOSIS — H353290 Exudative age-related macular degeneration, unspecified eye, stage unspecified: Secondary | ICD-10-CM | POA: Diagnosis not present

## 2013-07-14 DIAGNOSIS — H35359 Cystoid macular degeneration, unspecified eye: Secondary | ICD-10-CM | POA: Diagnosis not present

## 2013-07-14 DIAGNOSIS — H35369 Drusen (degenerative) of macula, unspecified eye: Secondary | ICD-10-CM | POA: Diagnosis not present

## 2013-07-14 DIAGNOSIS — H35059 Retinal neovascularization, unspecified, unspecified eye: Secondary | ICD-10-CM | POA: Diagnosis not present

## 2013-08-27 DIAGNOSIS — Z23 Encounter for immunization: Secondary | ICD-10-CM | POA: Diagnosis not present

## 2013-09-01 DIAGNOSIS — H353290 Exudative age-related macular degeneration, unspecified eye, stage unspecified: Secondary | ICD-10-CM | POA: Diagnosis not present

## 2013-09-01 DIAGNOSIS — H35059 Retinal neovascularization, unspecified, unspecified eye: Secondary | ICD-10-CM | POA: Diagnosis not present

## 2013-10-20 DIAGNOSIS — H353290 Exudative age-related macular degeneration, unspecified eye, stage unspecified: Secondary | ICD-10-CM | POA: Diagnosis not present

## 2013-10-20 DIAGNOSIS — H35059 Retinal neovascularization, unspecified, unspecified eye: Secondary | ICD-10-CM | POA: Diagnosis not present

## 2013-11-23 DIAGNOSIS — N390 Urinary tract infection, site not specified: Secondary | ICD-10-CM | POA: Diagnosis not present

## 2013-11-23 DIAGNOSIS — N3000 Acute cystitis without hematuria: Secondary | ICD-10-CM | POA: Diagnosis not present

## 2013-12-01 DIAGNOSIS — H35059 Retinal neovascularization, unspecified, unspecified eye: Secondary | ICD-10-CM | POA: Diagnosis not present

## 2013-12-01 DIAGNOSIS — H353290 Exudative age-related macular degeneration, unspecified eye, stage unspecified: Secondary | ICD-10-CM | POA: Diagnosis not present

## 2014-01-02 DIAGNOSIS — I10 Essential (primary) hypertension: Secondary | ICD-10-CM | POA: Diagnosis not present

## 2014-01-02 DIAGNOSIS — R040 Epistaxis: Secondary | ICD-10-CM | POA: Diagnosis not present

## 2014-01-06 DIAGNOSIS — H6120 Impacted cerumen, unspecified ear: Secondary | ICD-10-CM | POA: Diagnosis not present

## 2014-01-06 DIAGNOSIS — J342 Deviated nasal septum: Secondary | ICD-10-CM | POA: Diagnosis not present

## 2014-01-06 DIAGNOSIS — R040 Epistaxis: Secondary | ICD-10-CM | POA: Diagnosis not present

## 2014-01-17 DIAGNOSIS — H3530 Unspecified macular degeneration: Secondary | ICD-10-CM | POA: Diagnosis not present

## 2014-01-19 DIAGNOSIS — H35359 Cystoid macular degeneration, unspecified eye: Secondary | ICD-10-CM | POA: Diagnosis not present

## 2014-01-19 DIAGNOSIS — H353290 Exudative age-related macular degeneration, unspecified eye, stage unspecified: Secondary | ICD-10-CM | POA: Diagnosis not present

## 2014-01-19 DIAGNOSIS — H35059 Retinal neovascularization, unspecified, unspecified eye: Secondary | ICD-10-CM | POA: Diagnosis not present

## 2014-02-23 DIAGNOSIS — H35059 Retinal neovascularization, unspecified, unspecified eye: Secondary | ICD-10-CM | POA: Diagnosis not present

## 2014-02-23 DIAGNOSIS — H353290 Exudative age-related macular degeneration, unspecified eye, stage unspecified: Secondary | ICD-10-CM | POA: Diagnosis not present

## 2014-03-30 DIAGNOSIS — H353290 Exudative age-related macular degeneration, unspecified eye, stage unspecified: Secondary | ICD-10-CM | POA: Diagnosis not present

## 2014-03-30 DIAGNOSIS — H35059 Retinal neovascularization, unspecified, unspecified eye: Secondary | ICD-10-CM | POA: Diagnosis not present

## 2014-05-04 DIAGNOSIS — H353290 Exudative age-related macular degeneration, unspecified eye, stage unspecified: Secondary | ICD-10-CM | POA: Diagnosis not present

## 2014-05-04 DIAGNOSIS — H35059 Retinal neovascularization, unspecified, unspecified eye: Secondary | ICD-10-CM | POA: Diagnosis not present

## 2014-06-15 DIAGNOSIS — H353290 Exudative age-related macular degeneration, unspecified eye, stage unspecified: Secondary | ICD-10-CM | POA: Diagnosis not present

## 2014-06-15 DIAGNOSIS — H35059 Retinal neovascularization, unspecified, unspecified eye: Secondary | ICD-10-CM | POA: Diagnosis not present

## 2014-08-03 DIAGNOSIS — H353290 Exudative age-related macular degeneration, unspecified eye, stage unspecified: Secondary | ICD-10-CM | POA: Diagnosis not present

## 2014-08-03 DIAGNOSIS — H35059 Retinal neovascularization, unspecified, unspecified eye: Secondary | ICD-10-CM | POA: Diagnosis not present

## 2014-08-09 DIAGNOSIS — Z23 Encounter for immunization: Secondary | ICD-10-CM | POA: Diagnosis not present

## 2014-09-07 DIAGNOSIS — H3532 Exudative age-related macular degeneration: Secondary | ICD-10-CM | POA: Diagnosis not present

## 2014-09-07 DIAGNOSIS — H35051 Retinal neovascularization, unspecified, right eye: Secondary | ICD-10-CM | POA: Diagnosis not present

## 2014-09-29 DIAGNOSIS — Z23 Encounter for immunization: Secondary | ICD-10-CM | POA: Diagnosis not present

## 2014-09-29 DIAGNOSIS — E559 Vitamin D deficiency, unspecified: Secondary | ICD-10-CM | POA: Diagnosis not present

## 2014-09-29 DIAGNOSIS — K210 Gastro-esophageal reflux disease with esophagitis: Secondary | ICD-10-CM | POA: Diagnosis not present

## 2014-09-29 DIAGNOSIS — Z79899 Other long term (current) drug therapy: Secondary | ICD-10-CM | POA: Diagnosis not present

## 2014-09-29 DIAGNOSIS — E782 Mixed hyperlipidemia: Secondary | ICD-10-CM | POA: Diagnosis not present

## 2014-09-29 DIAGNOSIS — M810 Age-related osteoporosis without current pathological fracture: Secondary | ICD-10-CM | POA: Diagnosis not present

## 2014-09-29 DIAGNOSIS — I10 Essential (primary) hypertension: Secondary | ICD-10-CM | POA: Diagnosis not present

## 2014-10-17 DIAGNOSIS — H3532 Exudative age-related macular degeneration: Secondary | ICD-10-CM | POA: Diagnosis not present

## 2014-10-17 DIAGNOSIS — H35051 Retinal neovascularization, unspecified, right eye: Secondary | ICD-10-CM | POA: Diagnosis not present

## 2014-11-21 DIAGNOSIS — H3532 Exudative age-related macular degeneration: Secondary | ICD-10-CM | POA: Diagnosis not present

## 2014-11-30 DIAGNOSIS — H3532 Exudative age-related macular degeneration: Secondary | ICD-10-CM | POA: Diagnosis not present

## 2014-12-25 DIAGNOSIS — N3090 Cystitis, unspecified without hematuria: Secondary | ICD-10-CM | POA: Diagnosis not present

## 2014-12-25 DIAGNOSIS — N3001 Acute cystitis with hematuria: Secondary | ICD-10-CM | POA: Diagnosis not present

## 2014-12-29 DIAGNOSIS — H35051 Retinal neovascularization, unspecified, right eye: Secondary | ICD-10-CM | POA: Diagnosis not present

## 2014-12-29 DIAGNOSIS — H3532 Exudative age-related macular degeneration: Secondary | ICD-10-CM | POA: Diagnosis not present

## 2015-02-09 DIAGNOSIS — H3532 Exudative age-related macular degeneration: Secondary | ICD-10-CM | POA: Diagnosis not present

## 2015-02-19 DIAGNOSIS — N3001 Acute cystitis with hematuria: Secondary | ICD-10-CM | POA: Diagnosis not present

## 2015-02-19 DIAGNOSIS — N3090 Cystitis, unspecified without hematuria: Secondary | ICD-10-CM | POA: Diagnosis not present

## 2015-04-06 DIAGNOSIS — H3532 Exudative age-related macular degeneration: Secondary | ICD-10-CM | POA: Diagnosis not present

## 2015-05-11 DIAGNOSIS — H3532 Exudative age-related macular degeneration: Secondary | ICD-10-CM | POA: Diagnosis not present

## 2015-06-09 DIAGNOSIS — H3532 Exudative age-related macular degeneration: Secondary | ICD-10-CM | POA: Diagnosis not present

## 2015-06-20 DIAGNOSIS — S6992XA Unspecified injury of left wrist, hand and finger(s), initial encounter: Secondary | ICD-10-CM | POA: Diagnosis not present

## 2015-06-20 DIAGNOSIS — Z79899 Other long term (current) drug therapy: Secondary | ICD-10-CM | POA: Diagnosis not present

## 2015-06-20 DIAGNOSIS — I10 Essential (primary) hypertension: Secondary | ICD-10-CM | POA: Diagnosis not present

## 2015-06-20 DIAGNOSIS — S0031XA Abrasion of nose, initial encounter: Secondary | ICD-10-CM | POA: Diagnosis not present

## 2015-06-20 DIAGNOSIS — M79642 Pain in left hand: Secondary | ICD-10-CM | POA: Diagnosis not present

## 2015-06-20 DIAGNOSIS — S0121XA Laceration without foreign body of nose, initial encounter: Secondary | ICD-10-CM | POA: Diagnosis not present

## 2015-07-14 DIAGNOSIS — H3532 Exudative age-related macular degeneration: Secondary | ICD-10-CM | POA: Diagnosis not present

## 2015-08-09 DIAGNOSIS — Z23 Encounter for immunization: Secondary | ICD-10-CM | POA: Diagnosis not present

## 2015-08-28 DIAGNOSIS — H353211 Exudative age-related macular degeneration, right eye, with active choroidal neovascularization: Secondary | ICD-10-CM | POA: Diagnosis not present

## 2015-09-28 DIAGNOSIS — Z0000 Encounter for general adult medical examination without abnormal findings: Secondary | ICD-10-CM | POA: Diagnosis not present

## 2015-09-28 DIAGNOSIS — M810 Age-related osteoporosis without current pathological fracture: Secondary | ICD-10-CM | POA: Diagnosis not present

## 2015-09-28 DIAGNOSIS — E782 Mixed hyperlipidemia: Secondary | ICD-10-CM | POA: Diagnosis not present

## 2015-09-28 DIAGNOSIS — R262 Difficulty in walking, not elsewhere classified: Secondary | ICD-10-CM | POA: Diagnosis not present

## 2015-09-28 DIAGNOSIS — Z79899 Other long term (current) drug therapy: Secondary | ICD-10-CM | POA: Diagnosis not present

## 2015-09-28 DIAGNOSIS — E8881 Metabolic syndrome: Secondary | ICD-10-CM | POA: Diagnosis not present

## 2015-09-28 DIAGNOSIS — H3530 Unspecified macular degeneration: Secondary | ICD-10-CM | POA: Diagnosis not present

## 2015-10-16 DIAGNOSIS — H353211 Exudative age-related macular degeneration, right eye, with active choroidal neovascularization: Secondary | ICD-10-CM | POA: Diagnosis not present

## 2015-12-11 DIAGNOSIS — H35351 Cystoid macular degeneration, right eye: Secondary | ICD-10-CM | POA: Diagnosis not present

## 2015-12-11 DIAGNOSIS — H353211 Exudative age-related macular degeneration, right eye, with active choroidal neovascularization: Secondary | ICD-10-CM | POA: Diagnosis not present

## 2016-02-05 DIAGNOSIS — H353211 Exudative age-related macular degeneration, right eye, with active choroidal neovascularization: Secondary | ICD-10-CM | POA: Diagnosis not present

## 2016-02-05 DIAGNOSIS — H35351 Cystoid macular degeneration, right eye: Secondary | ICD-10-CM | POA: Diagnosis not present

## 2016-02-16 DIAGNOSIS — N3001 Acute cystitis with hematuria: Secondary | ICD-10-CM | POA: Diagnosis not present

## 2016-02-16 DIAGNOSIS — N3091 Cystitis, unspecified with hematuria: Secondary | ICD-10-CM | POA: Diagnosis not present

## 2016-03-25 DIAGNOSIS — H353211 Exudative age-related macular degeneration, right eye, with active choroidal neovascularization: Secondary | ICD-10-CM | POA: Diagnosis not present

## 2016-04-11 DIAGNOSIS — H3530 Unspecified macular degeneration: Secondary | ICD-10-CM | POA: Diagnosis not present

## 2016-05-15 DIAGNOSIS — H35351 Cystoid macular degeneration, right eye: Secondary | ICD-10-CM | POA: Diagnosis not present

## 2016-05-15 DIAGNOSIS — H353211 Exudative age-related macular degeneration, right eye, with active choroidal neovascularization: Secondary | ICD-10-CM | POA: Diagnosis not present

## 2016-05-15 DIAGNOSIS — H353134 Nonexudative age-related macular degeneration, bilateral, advanced atrophic with subfoveal involvement: Secondary | ICD-10-CM | POA: Diagnosis not present

## 2016-06-14 DIAGNOSIS — R300 Dysuria: Secondary | ICD-10-CM | POA: Diagnosis not present

## 2016-06-14 DIAGNOSIS — N3091 Cystitis, unspecified with hematuria: Secondary | ICD-10-CM | POA: Diagnosis not present

## 2016-07-02 DIAGNOSIS — N3001 Acute cystitis with hematuria: Secondary | ICD-10-CM | POA: Diagnosis not present

## 2016-07-02 DIAGNOSIS — R300 Dysuria: Secondary | ICD-10-CM | POA: Diagnosis not present

## 2016-07-02 DIAGNOSIS — R319 Hematuria, unspecified: Secondary | ICD-10-CM | POA: Diagnosis not present

## 2016-07-17 DIAGNOSIS — H353211 Exudative age-related macular degeneration, right eye, with active choroidal neovascularization: Secondary | ICD-10-CM | POA: Diagnosis not present

## 2016-08-20 DIAGNOSIS — Z23 Encounter for immunization: Secondary | ICD-10-CM | POA: Diagnosis not present

## 2016-09-24 DIAGNOSIS — H353211 Exudative age-related macular degeneration, right eye, with active choroidal neovascularization: Secondary | ICD-10-CM | POA: Diagnosis not present

## 2016-09-26 DIAGNOSIS — E782 Mixed hyperlipidemia: Secondary | ICD-10-CM | POA: Diagnosis not present

## 2016-09-26 DIAGNOSIS — M810 Age-related osteoporosis without current pathological fracture: Secondary | ICD-10-CM | POA: Diagnosis not present

## 2016-09-26 DIAGNOSIS — Z79899 Other long term (current) drug therapy: Secondary | ICD-10-CM | POA: Diagnosis not present

## 2016-09-26 DIAGNOSIS — E559 Vitamin D deficiency, unspecified: Secondary | ICD-10-CM | POA: Diagnosis not present

## 2016-09-26 DIAGNOSIS — H3530 Unspecified macular degeneration: Secondary | ICD-10-CM | POA: Diagnosis not present

## 2016-09-26 DIAGNOSIS — K210 Gastro-esophageal reflux disease with esophagitis: Secondary | ICD-10-CM | POA: Diagnosis not present

## 2016-09-26 DIAGNOSIS — I10 Essential (primary) hypertension: Secondary | ICD-10-CM | POA: Diagnosis not present

## 2016-09-26 DIAGNOSIS — R7309 Other abnormal glucose: Secondary | ICD-10-CM | POA: Diagnosis not present

## 2016-11-06 DIAGNOSIS — B029 Zoster without complications: Secondary | ICD-10-CM | POA: Diagnosis not present

## 2016-12-20 DIAGNOSIS — N3001 Acute cystitis with hematuria: Secondary | ICD-10-CM | POA: Diagnosis not present

## 2017-01-01 DIAGNOSIS — H353211 Exudative age-related macular degeneration, right eye, with active choroidal neovascularization: Secondary | ICD-10-CM | POA: Diagnosis not present

## 2017-02-23 DIAGNOSIS — R300 Dysuria: Secondary | ICD-10-CM | POA: Diagnosis not present

## 2017-02-23 DIAGNOSIS — N3090 Cystitis, unspecified without hematuria: Secondary | ICD-10-CM | POA: Diagnosis not present

## 2017-02-26 DIAGNOSIS — H353134 Nonexudative age-related macular degeneration, bilateral, advanced atrophic with subfoveal involvement: Secondary | ICD-10-CM | POA: Diagnosis not present

## 2017-02-26 DIAGNOSIS — H353211 Exudative age-related macular degeneration, right eye, with active choroidal neovascularization: Secondary | ICD-10-CM | POA: Diagnosis not present

## 2017-03-23 DIAGNOSIS — N3001 Acute cystitis with hematuria: Secondary | ICD-10-CM | POA: Diagnosis not present

## 2017-03-23 DIAGNOSIS — N3090 Cystitis, unspecified without hematuria: Secondary | ICD-10-CM | POA: Diagnosis not present

## 2017-04-23 DIAGNOSIS — H353113 Nonexudative age-related macular degeneration, right eye, advanced atrophic without subfoveal involvement: Secondary | ICD-10-CM | POA: Diagnosis not present

## 2017-04-23 DIAGNOSIS — N200 Calculus of kidney: Secondary | ICD-10-CM | POA: Diagnosis not present

## 2017-04-23 DIAGNOSIS — H353211 Exudative age-related macular degeneration, right eye, with active choroidal neovascularization: Secondary | ICD-10-CM | POA: Diagnosis not present

## 2017-04-23 DIAGNOSIS — H35351 Cystoid macular degeneration, right eye: Secondary | ICD-10-CM | POA: Diagnosis not present

## 2017-04-23 DIAGNOSIS — N3090 Cystitis, unspecified without hematuria: Secondary | ICD-10-CM | POA: Diagnosis not present

## 2017-05-02 DIAGNOSIS — N390 Urinary tract infection, site not specified: Secondary | ICD-10-CM | POA: Diagnosis not present

## 2017-05-02 DIAGNOSIS — N200 Calculus of kidney: Secondary | ICD-10-CM | POA: Diagnosis not present

## 2017-05-14 DIAGNOSIS — N200 Calculus of kidney: Secondary | ICD-10-CM | POA: Diagnosis not present

## 2017-05-14 DIAGNOSIS — N3090 Cystitis, unspecified without hematuria: Secondary | ICD-10-CM | POA: Diagnosis not present

## 2017-06-06 DIAGNOSIS — H3530 Unspecified macular degeneration: Secondary | ICD-10-CM | POA: Diagnosis not present

## 2017-06-24 DIAGNOSIS — N3090 Cystitis, unspecified without hematuria: Secondary | ICD-10-CM | POA: Diagnosis not present

## 2017-07-02 DIAGNOSIS — H401132 Primary open-angle glaucoma, bilateral, moderate stage: Secondary | ICD-10-CM | POA: Diagnosis not present

## 2017-07-02 DIAGNOSIS — H353113 Nonexudative age-related macular degeneration, right eye, advanced atrophic without subfoveal involvement: Secondary | ICD-10-CM | POA: Diagnosis not present

## 2017-07-02 DIAGNOSIS — H353124 Nonexudative age-related macular degeneration, left eye, advanced atrophic with subfoveal involvement: Secondary | ICD-10-CM | POA: Diagnosis not present

## 2017-07-02 DIAGNOSIS — H353212 Exudative age-related macular degeneration, right eye, with inactive choroidal neovascularization: Secondary | ICD-10-CM | POA: Diagnosis not present

## 2017-07-28 DIAGNOSIS — M79672 Pain in left foot: Secondary | ICD-10-CM | POA: Diagnosis not present

## 2017-07-28 DIAGNOSIS — M722 Plantar fascial fibromatosis: Secondary | ICD-10-CM | POA: Diagnosis not present

## 2017-07-30 DIAGNOSIS — Z23 Encounter for immunization: Secondary | ICD-10-CM | POA: Diagnosis not present

## 2017-09-24 DIAGNOSIS — R351 Nocturia: Secondary | ICD-10-CM | POA: Diagnosis not present

## 2017-09-24 DIAGNOSIS — N3090 Cystitis, unspecified without hematuria: Secondary | ICD-10-CM | POA: Diagnosis not present

## 2017-09-29 DIAGNOSIS — H353113 Nonexudative age-related macular degeneration, right eye, advanced atrophic without subfoveal involvement: Secondary | ICD-10-CM | POA: Diagnosis not present

## 2017-09-29 DIAGNOSIS — H353124 Nonexudative age-related macular degeneration, left eye, advanced atrophic with subfoveal involvement: Secondary | ICD-10-CM | POA: Diagnosis not present

## 2017-09-29 DIAGNOSIS — H401132 Primary open-angle glaucoma, bilateral, moderate stage: Secondary | ICD-10-CM | POA: Diagnosis not present

## 2017-09-29 DIAGNOSIS — H353212 Exudative age-related macular degeneration, right eye, with inactive choroidal neovascularization: Secondary | ICD-10-CM | POA: Diagnosis not present

## 2017-11-05 DIAGNOSIS — N3090 Cystitis, unspecified without hematuria: Secondary | ICD-10-CM | POA: Diagnosis not present

## 2017-11-05 DIAGNOSIS — R351 Nocturia: Secondary | ICD-10-CM | POA: Diagnosis not present

## 2018-01-16 DIAGNOSIS — I4819 Other persistent atrial fibrillation: Secondary | ICD-10-CM

## 2018-01-16 HISTORY — DX: Other persistent atrial fibrillation: I48.19

## 2018-01-25 ENCOUNTER — Inpatient Hospital Stay (HOSPITAL_COMMUNITY)
Admission: EM | Admit: 2018-01-25 | Discharge: 2018-01-28 | DRG: 308 | Disposition: A | Discharge status: Home / Self Care | Payer: Medicare Other | Attending: Internal Medicine | Admitting: Internal Medicine

## 2018-01-25 ENCOUNTER — Emergency Department (HOSPITAL_COMMUNITY): Payer: Medicare Other

## 2018-01-25 ENCOUNTER — Encounter (HOSPITAL_COMMUNITY): Payer: Self-pay | Admitting: Emergency Medicine

## 2018-01-25 DIAGNOSIS — Z91048 Other nonmedicinal substance allergy status: Secondary | ICD-10-CM

## 2018-01-25 DIAGNOSIS — I4891 Unspecified atrial fibrillation: Secondary | ICD-10-CM | POA: Diagnosis not present

## 2018-01-25 DIAGNOSIS — I5031 Acute diastolic (congestive) heart failure: Secondary | ICD-10-CM | POA: Diagnosis not present

## 2018-01-25 DIAGNOSIS — I110 Hypertensive heart disease with heart failure: Secondary | ICD-10-CM | POA: Diagnosis present

## 2018-01-25 DIAGNOSIS — I10 Essential (primary) hypertension: Secondary | ICD-10-CM | POA: Diagnosis present

## 2018-01-25 DIAGNOSIS — Z9181 History of falling: Secondary | ICD-10-CM

## 2018-01-25 DIAGNOSIS — I071 Rheumatic tricuspid insufficiency: Secondary | ICD-10-CM | POA: Diagnosis present

## 2018-01-25 DIAGNOSIS — H548 Legal blindness, as defined in USA: Secondary | ICD-10-CM | POA: Diagnosis present

## 2018-01-25 DIAGNOSIS — Z8249 Family history of ischemic heart disease and other diseases of the circulatory system: Secondary | ICD-10-CM

## 2018-01-25 DIAGNOSIS — Z79899 Other long term (current) drug therapy: Secondary | ICD-10-CM

## 2018-01-25 HISTORY — DX: Calculus of kidney: N20.0

## 2018-01-25 HISTORY — DX: Essential (primary) hypertension: I10

## 2018-01-25 LAB — CBC
HEMATOCRIT: 38.4 % (ref 36.0–46.0)
Hemoglobin: 12.6 g/dL (ref 12.0–15.0)
MCH: 29.9 pg (ref 26.0–34.0)
MCHC: 32.8 g/dL (ref 30.0–36.0)
MCV: 91.2 fL (ref 78.0–100.0)
Platelets: 140 10*3/uL — ABNORMAL LOW (ref 150–400)
RBC: 4.21 MIL/uL (ref 3.87–5.11)
RDW: 14.3 % (ref 11.5–15.5)
WBC: 12 10*3/uL — ABNORMAL HIGH (ref 4.0–10.5)

## 2018-01-25 LAB — BASIC METABOLIC PANEL
ANION GAP: 13 (ref 5–15)
BUN: 11 mg/dL (ref 6–20)
CALCIUM: 9.9 mg/dL (ref 8.9–10.3)
CO2: 26 mmol/L (ref 22–32)
Chloride: 102 mmol/L (ref 101–111)
Creatinine, Ser: 0.75 mg/dL (ref 0.44–1.00)
GFR calc non Af Amer: >60 mL/min (ref >60)
Glucose, Bld: 152 mg/dL — ABNORMAL HIGH (ref 65–99)
POTASSIUM: 3.5 mmol/L (ref 3.5–5.1)
Sodium: 141 mmol/L (ref 135–145)

## 2018-01-25 LAB — TROPONIN I: Troponin I: <0.03 ng/mL (ref <0.03)

## 2018-01-25 LAB — BRAIN NATRIURETIC PEPTIDE: B Natriuretic Peptide: 663 pg/mL — ABNORMAL HIGH (ref 0.0–100.0)

## 2018-01-25 MED ORDER — SODIUM CHLORIDE 0.9% FLUSH: 3.0000 mL | INTRAVENOUS | Status: DC | PRN

## 2018-01-25 MED ORDER — ONDANSETRON HCL 4 MG/2ML IJ SOLN: 4.0000 mg | Freq: Four times a day (QID) | INTRAMUSCULAR | Status: DC | PRN

## 2018-01-25 MED ORDER — DILTIAZEM HCL 30 MG PO TABS: 30.0000 mg | ORAL_TABLET | Freq: Four times a day (QID) | ORAL | Status: DC

## 2018-01-25 MED ORDER — APIXABAN 5 MG PO TABS
5.0000 mg | ORAL_TABLET | Freq: Two times a day (BID) | ORAL | Status: DC
  Administered 2018-01-25 – 2018-01-28 (×6): 5 mg via ORAL
  Filled 2018-01-25 (×6): qty 1

## 2018-01-25 MED ORDER — BENAZEPRIL HCL 40 MG PO TABS
40.0000 mg | ORAL_TABLET | Freq: Every day | ORAL | Status: DC
  Administered 2018-01-26: 40 mg via ORAL
  Filled 2018-01-25: qty 1

## 2018-01-25 MED ORDER — LATANOPROST 0.005 % OP SOLN
1.0000 [drp] | Freq: Every day | OPHTHALMIC | Status: DC
  Administered 2018-01-26 – 2018-01-27 (×3): 1 [drp] via OPHTHALMIC
  Filled 2018-01-25: qty 2.5

## 2018-01-25 MED ORDER — ICAPS PO CAPS: ORAL_CAPSULE | Freq: Two times a day (BID) | ORAL | Status: DC

## 2018-01-25 MED ORDER — ACETAMINOPHEN 500 MG PO TABS
500.0000 mg | ORAL_TABLET | Freq: Four times a day (QID) | ORAL | Status: DC | PRN
  Administered 2018-01-26 – 2018-01-27 (×2): 500 mg via ORAL
  Filled 2018-01-25 (×3): qty 1

## 2018-01-25 MED ORDER — ACETAMINOPHEN 325 MG PO TABS: 650.0000 mg | ORAL_TABLET | ORAL | Status: DC | PRN

## 2018-01-25 MED ORDER — SODIUM CHLORIDE 0.9% FLUSH
3.0000 mL | Freq: Two times a day (BID) | INTRAVENOUS | Status: DC
  Administered 2018-01-25 – 2018-01-28 (×4): 3 mL via INTRAVENOUS

## 2018-01-25 MED ORDER — VITAMIN D 1000 UNITS PO TABS
3000.0000 [IU] | ORAL_TABLET | Freq: Every day | ORAL | Status: DC
  Administered 2018-01-26 – 2018-01-28 (×3): 3000 [IU] via ORAL
  Filled 2018-01-25 (×3): qty 3

## 2018-01-25 MED ORDER — PANTOPRAZOLE SODIUM 40 MG PO TBEC
40.0000 mg | DELAYED_RELEASE_TABLET | Freq: Every day | ORAL | Status: DC
  Administered 2018-01-26 – 2018-01-28 (×3): 40 mg via ORAL
  Filled 2018-01-25 (×3): qty 1

## 2018-01-25 MED ORDER — CARVEDILOL 12.5 MG PO TABS
25.0000 mg | ORAL_TABLET | Freq: Two times a day (BID) | ORAL | Status: DC
  Administered 2018-01-25 – 2018-01-28 (×6): 25 mg via ORAL
  Filled 2018-01-25 (×6): qty 2

## 2018-01-25 MED ORDER — SODIUM CHLORIDE 0.9 % IV SOLN: 250.0000 mL | INTRAVENOUS | Status: DC | PRN

## 2018-01-25 MED ORDER — DILTIAZEM HCL 25 MG/5ML IV SOLN
10.0000 mg | Freq: Once | INTRAVENOUS | Status: AC
  Administered 2018-01-25: 10 mg via INTRAVENOUS
  Filled 2018-01-25: qty 5

## 2018-01-25 MED ORDER — DILTIAZEM HCL 30 MG PO TABS
30.0000 mg | ORAL_TABLET | Freq: Four times a day (QID) | ORAL | Status: DC
  Administered 2018-01-26 – 2018-01-27 (×8): 30 mg via ORAL
  Filled 2018-01-25 (×7): qty 1

## 2018-01-25 MED ORDER — POLYETHYL GLYCOL-PROPYL GLYCOL 0.4-0.3 % OP GEL: Freq: Two times a day (BID) | OPHTHALMIC | Status: DC

## 2018-01-25 NOTE — H&P (Signed)
History and Physical    Samantha Ageeula E Strehle AVW:[id number] DOB: [birth year] DOA: 01/25/2018  PCP: Patient, No Pcp Per  Patient coming from: Home  I have personally briefly reviewed patient's [facility] in [facility]  Chief Complaint: Palpitations  HPI: Samantha [name] is a 84 y.o. female with medical history significant of HTN and kidney stones only.  Patient presents to the ED with palpitations.  Intermittently having palpitations including yesterday.  Had again this morning, wasn't feeling well, went to UC, found to have A.Fib RVR and sent in to ED.  Also started on nitrofurantoin for possible UTI.    ED Course: HR 144, given 10mg  IV cardizem, HR now 100-110.  CXR shows mild pulm edema.   Review of Systems: As per HPI otherwise 10 point review of systems negative.   Past Medical History:  Diagnosis Date  . Hypertension   . Kidney stones, calcium oxalate     History reviewed. No pertinent surgical history.   reports that  has never smoked. She does not have any smokeless tobacco history on file. She reports that she does not drink alcohol or use drugs.  Allergies  Allergen Reactions  . Tape Rash    Please use paper tape    Family History  Problem Relation Age of Onset  . Heart disease Mother   . Heart disease Father      Prior to Admission medications   Medication Sig Start Date End Date Taking? Authorizing Provider  acetaminophen (TYLENOL) 500 MG tablet Take 500 mg by mouth every 6 (six) hours as needed for headache (pain).   Yes [provider]  amLODipine (NORVASC) 10 MG tablet Take 10 mg by mouth daily. 12/08/17  Yes [provider]  benazepril (LOTENSIN) 40 MG tablet Take 40 mg by mouth daily. 11/04/17  Yes [provider]  carvedilol (COREG) 25 MG tablet Take 25 mg by mouth 2 (two) times daily. 11/04/17  Yes [provider]  cholecalciferol (VITAMIN D) 1000 units tablet Take 3,000 Units by mouth daily.   Yes [provider]  D-Mannose POWD Take 5 mLs by mouth daily. Mix in 8 oz water and drink   Yes [provider]  hyoscyamine (LEVSIN SL) 0.125 MG SL tablet Take 0.125 mg by mouth at bedtime. 12/08/17  Yes [provider]  latanoprost (XALATAN) 0.005 % ophthalmic solution Place 1 drop into both eyes at bedtime. 01/05/18  Yes [provider]  Multiple Vitamins-Minerals (ICAPS PO) Take 1 capsule by mouth 2 (two) times daily.   Yes [provider]  Omega-3 Fatty Acids (FISH OIL) 1000 MG CAPS Take 1,000 mg by mouth daily.   Yes [provider]  pantoprazole (PROTONIX) 40 MG tablet Take 40 mg by mouth daily. 11/04/17  Yes [provider]  Polyethyl Glycol-Propyl Glycol (SYSTANE OP) Place 1 drop into both eyes 2 (two) times daily.   Yes [provider]    Physical Exam: Vitals:   01/25/18 1730 01/25/18 1830 01/25/18 1930 01/25/18 2000  BP: 126/80 (!) 78/65 119/81 129/79  Pulse: (!) 109 78 (!) 117 85  Resp: 19 (!) 27 (!) 21 (!) 27  Temp:      SpO2: 93% 92% 94% 95%    Constitutional: NAD, calm, comfortable Eyes: PERRL, lids and conjunctivae normal ENMT: Mucous membranes are moist. Posterior pharynx clear of any exudate or lesions.Normal dentition.  Neck: normal, supple, no masses, no thyromegaly Respiratory: clear to auscultation bilaterally, no wheezing, no crackles.  Normal respiratory effort. No accessory muscle use.  Cardiovascular: IRR, IRR, tachycardic Abdomen: no tenderness, no masses palpated. No hepatosplenomegaly. Bowel sounds positive.  Musculoskeletal: no clubbing / cyanosis. No joint deformity upper and lower extremities. Good ROM, no contractures. Normal muscle tone.  Skin: no rashes, lesions, ulcers. No induration Neurologic: CN 2-12 grossly intact. Sensation intact, DTR normal. Strength 5/5 in all 4.  Psychiatric: Normal judgment and insight. Alert and oriented x 3. Normal mood.    Labs on Admission: I have personally  reviewed following labs and imaging studies  CBC: Recent Labs  Lab 01/25/18 1437  WBC 12.0*  HGB 12.6  HCT 38.4  MCV 91.2  PLT 140*   Basic Metabolic Panel: Recent Labs  Lab 01/25/18 1437  NA 141  K 3.5  CL 102  CO2 26  GLUCOSE 152*  BUN 11  CREATININE 0.75  CALCIUM 9.9   GFR: CrCl cannot be calculated (Unknown ideal weight.). Liver Function Tests: No results for input(s): AST, ALT, ALKPHOS, BILITOT, PROT, ALBUMIN in the last 168 hours. No results for input(s): LIPASE, AMYLASE in the last 168 hours. No results for input(s): AMMONIA in the last 168 hours. Coagulation Profile: No results for input(s): INR, PROTIME in the last 168 hours. Cardiac Enzymes: Recent Labs  Lab 01/25/18 1743  TROPONINI <0.03   BNP (last 3 results) No results for input(s): PROBNP in the last 8760 hours. HbA1C: No results for input(s): HGBA1C in the last 72 hours. CBG: No results for input(s): GLUCAP in the last 168 hours. Lipid Profile: No results for input(s): CHOL, HDL, LDLCALC, TRIG, CHOLHDL, LDLDIRECT in the last 72 hours. Thyroid Function Tests: No results for input(s): TSH, T4TOTAL, FREET4, T3FREE, THYROIDAB in the last 72 hours. Anemia Panel: No results for input(s): VITAMINB12, FOLATE, FERRITIN, TIBC, IRON, RETICCTPCT in the last 72 hours. Urine analysis: No results found for: COLORURINE, APPEARANCEUR, LABSPEC, PHURINE, GLUCOSEU, HGBUR, BILIRUBINUR, KETONESUR, PROTEINUR, UROBILINOGEN, NITRITE, LEUKOCYTESUR  Radiological Exams on Admission: Dg Chest 2 View  Result Date: 01/25/2018 CLINICAL DATA:  Atrial fibrillation. EXAM: CHEST - 2 VIEW COMPARISON:  Chest x-ray dated January 03, 2006. FINDINGS: The heart size and mediastinal contours are within normal limits. Increased interstitial markings bilaterally. The lungs are hyperinflated. Scarring/atelectasis at the left lung base. No focal consolidation, pleural effusion, or pneumothorax. No acute osseous abnormality. Unchanged severe  degenerative disc disease at the thoracolumbar junction. IMPRESSION: 1. Mild interstitial pulmonary edema. Electronically Signed   By: Obie Dredge M.D.   On: 01/25/2018 15:00    EKG: Independently reviewed.  Assessment/Plan Principal Problem:   Atrial fibrillation with RVR (HCC) Active Problems:   Acute diastolic CHF (congestive heart failure) (HCC)   HTN (hypertension)    1. A.Fib RVR - 1. Cardizem 30mg  PO Q6H ordered (convert to XR when dosing finalized) 2. Continue home coreg 3. Starting eliquis 4. Needs follow up - sounds like family would like to see Dr. Johney Frame 2. Acute diastolic CHF - 1. Suspect rate induced / mediated 2. 2d echo 3. HTN - 1. Continue lisinopril 2. Continue coreg 3. Will hold amlodipine for now to make room in BP for cardizem to be added.  DVT prophylaxis: Lovenox Code Status: Full Family Communication: Family at bedside, all questions answered Disposition Plan: Home after admit Consults called: None Admission status: Place in [place], Heywood Iles. DO [facility] Pager [phone]  If 7AM-7PM, please contact day team taking care of patient [email or web address] Password [facility]  01/25/2018, 8:54 PM

## 2018-01-25 NOTE — ED Notes (Signed)
Pt ambulatory to bathroom accompanied by this RN, oxygen sats dropped to 88% on room air. Pt denies SOB or dizziness

## 2018-01-25 NOTE — ED Triage Notes (Signed)
Pt states yesterday she noticed she was nauseated but thought it was from a urinary infection. Pt states today she called her daughter because she felt her heart was fluttering. EKG at urgent care showed afib.

## 2018-01-25 NOTE — Progress Notes (Signed)
ANTICOAGULATION CONSULT NOTE Pharmacy Consult for apixaban Indication: atrial fibrillation  Allergies  Allergen Reactions  . Tape Rash    Please use paper tape    Patient Measurements:   Vital Signs: Temp: 98.6 F (37 C) (03/10 1422) BP: 131/93 (03/10 1426) Pulse Rate: 144 (03/10 1422)  Labs: Recent Labs    01/25/18 1437  HGB 12.6  HCT 38.4  PLT 140*  CREATININE 0.75    CrCl cannot be calculated (Unknown ideal weight.).   Medical History: Past Medical History:  Diagnosis Date  . Hypertension     Assessment: 84 yo female admitted with new onset afib and to start apixaban. No known a/c PTA. CBC stable. Age 84, Scr 0.75, Wt 70 kg per notes.   Goal of Therapy:  Monitor platelets by anticoagulation protocol: Yes   Plan:  1. Start Apixaban 5 mg BID   Pollyann SamplesAndy Jaretzy Lhommedieu, PharmD, BCPS 01/25/2018, 5:18 PM

## 2018-01-25 NOTE — ED Provider Notes (Signed)
Emergency Department Provider Note   I have reviewed the triage vital signs and the nursing notes.   HISTORY  Chief Complaint Atrial Fibrillation and Nausea   HPI Samantha [name] is a 84 y.o. female with only known history of kidney stones and hypertension the presents to the emergency department today secondary to irregular heartbeat found at urgent care.  Patient states that she intermittently will have palpitations and she remembers having them yesterday and then not feel well.  Then this morning she palpitations again along with nausea so called her family who took her to urgent care where she was found to have a urinary tract infection and was started on nitrofurantoin.  They also note she had an irregular heartbeat you are from 70s-140s so sent her to the emergency room for further evaluation.  Patient exhibits mild kidney habit does not complain of dyspnea.  She does not complain to be in lightheadedness or chest pain but just the palpitations that she mostly feels in her neck.  She has never had a history of atrial fibrillation or heart failure before.  She does note mild lower extremity edema that comes and goes.  No history of coronary artery disease.  Has been eating and drinking normally.  No nausea vomiting or constipation or diarrhea.No other associated or modifying symptoms.    Past Medical History:  Diagnosis Date  . Hypertension   . Kidney stones, calcium oxalate     Patient Active Problem List   Diagnosis Date Noted  . Atrial fibrillation with RVR (HCC) 01/25/2018  . Acute diastolic CHF (congestive heart failure) (HCC) 01/25/2018  . HTN (hypertension) 01/25/2018    History reviewed. No pertinent surgical history.    Allergies Tape  Family History  Problem Relation Age of Onset  . Heart disease Mother   . Heart disease Father     Social History Social History   Tobacco Use  . Smoking status: Never Smoker  Substance Use Topics  . Alcohol use: No   Frequency: Never  . Drug use: No    Review of Systems  All other systems negative except as documented in the HPI. All pertinent positives and negatives as reviewed in the HPI. ____________________________________________   PHYSICAL EXAM:  VITAL SIGNS: ED Triage Vitals  Enc Vitals Group     BP 01/25/18 1426 (!) 131/93     Pulse Rate 01/25/18 1422 (!) 144     Resp 01/25/18 1422 18     Temp 01/25/18 1422 98.6 F (37 C)     Temp src --      SpO2 01/25/18 1422 94 %    Constitutional: Alert and oriented. Well appearing and in no acute distress. Eyes: Conjunctivae are normal. PERRL. EOMI. Head: Atraumatic. Nose: No congestion/rhinnorhea. Mouth/Throat: Mucous membranes are moist.  Oropharynx non-erythematous. Neck: No stridor.  No meningeal signs.   Cardiovascular: tachycardic rate, irregular rhythm. Good peripheral circulation. Grossly normal heart sounds.   Respiratory: tachypneic respiratory effort.  No retractions. Lungs with diffuse crackles. Gastrointestinal: Soft and nontender. No distention.  Musculoskeletal: No lower extremity tenderness but does have mild BLE edema. No gross deformities of extremities. Neurologic:  Normal speech and language. No gross focal neurologic deficits are appreciated.  Skin:  Skin is warm, dry and intact. No rash noted.   ____________________________________________   LABS (all labs ordered are listed, but only abnormal results are displayed)  Labs Reviewed  BASIC METABOLIC PANEL - Abnormal; Notable for the following components:  Result Value   Glucose, Bld 152 (*)    All other components within normal limits  CBC - Abnormal; Notable for the following components:   WBC 12.0 (*)    Platelets 140 (*)    All other components within normal limits  BRAIN NATRIURETIC PEPTIDE - Abnormal; Notable for the following components:   B Natriuretic Peptide 663.0 (*)    All other components within normal limits  URINE CULTURE  TROPONIN I  CBC    BASIC METABOLIC PANEL  URINALYSIS, ROUTINE W REFLEX MICROSCOPIC   ____________________________________________  EKG   EKG Interpretation  Date/Time:  Sunday January 25 2018 14:21:08 EDT Ventricular Rate:  142 PR Interval:    QRS Duration: 100 QT Interval:  312 QTC Calculation: 479 R Axis:   -46 Text Interpretation:  Atrial fibrillation with rapid ventricular response Left axis deviation Nonspecific ST abnormality Abnormal ECG Confirmed by Benjiman Core [phone]) on 01/25/2018 3:25:52 PM       ____________________________________________  RADIOLOGY  Dg Chest 2 View  Result Date: 01/25/2018 CLINICAL DATA:  Atrial fibrillation. EXAM: CHEST - 2 VIEW COMPARISON:  Chest x-ray dated January 03, 2006. FINDINGS: The heart size and mediastinal contours are within normal limits. Increased interstitial markings bilaterally. The lungs are hyperinflated. Scarring/atelectasis at the left lung base. No focal consolidation, pleural effusion, or pneumothorax. No acute osseous abnormality. Unchanged severe degenerative disc disease at the thoracolumbar junction. IMPRESSION: 1. Mild interstitial pulmonary edema. Electronically Signed   By: Obie Dredge M.D.   On: 01/25/2018 15:00    ____________________________________________   PROCEDURES  Procedure(s) performed:   Procedures  CRITICAL CARE Performed by: Marily Memos Total critical care time: 35 minutes Critical care time was exclusive of separately billable procedures and treating other patients. Critical care was necessary to treat or prevent imminent or life-threatening deterioration. Critical care was time spent personally by me on the following activities: development of treatment plan with patient and/or surrogate as well as nursing, discussions with consultants, evaluation of patient's response to treatment, examination of patient, obtaining history from patient or surrogate, ordering and performing treatments and interventions,  ordering and review of laboratory studies, ordering and review of radiographic studies, pulse oximetry and re-evaluation of patient's condition.  ____________________________________________   INITIAL IMPRESSION / ASSESSMENT AND PLAN / ED COURSE  Already has a prescription for nitrofurantoin secondary to the urinary tract infection.  His atrial fibrillation is intermittently with rapid ventricular response.  It is relatively unclear when it started and they have than yesterday but I am not confident enough for cardioversion.  However she does have some mild heart failure with a with a crackles in her lungs mild lower extremity edema and pulmonary edema on her chest x-ray so will need to be rate controlled.  CHA2DS2-VASc is 4. Will check labs. Give dose of diltiazem. If not significant improvement in HR, will start infusion and admit.     Patient's heart rate improved to be consistently less than 115 but BNP elevated and with the crackles in her lungs and hypoxia to 80% when ambulating will admit to the hospital for rate control and ensure that her respiratory status improves in case she needs diuresis. Suspect it is from afib rvr at this point rather actual heart failure but may need echo to further elucidate.     Pertinent labs & imaging results that were available during my care of the patient were reviewed by me and considered in my medical decision making (see chart for details).  ____________________________________________  FINAL CLINICAL IMPRESSION(S) / ED DIAGNOSES  Final diagnoses:  Atrial fibrillation with rapid ventricular response (HCC)     MEDICATIONS GIVEN DURING THIS VISIT:  Medications  apixaban (ELIQUIS) tablet 5 mg (5 mg Oral Given 01/25/18 1729)  carvedilol (COREG) tablet 25 mg (25 mg Oral Given 01/25/18 2356)  benazepril (LOTENSIN) tablet 40 mg (not administered)  acetaminophen (TYLENOL) tablet 500 mg (not administered)  cholecalciferol (VITAMIN D) tablet 3,000  Units (not administered)  latanoprost (XALATAN) 0.005 % ophthalmic solution 1 drop (1 drop Both Eyes Given 01/26/18 0000)  pantoprazole (PROTONIX) EC tablet 40 mg (not administered)  ondansetron (ZOFRAN) injection 4 mg (not administered)  sodium chloride flush (NS) 0.9 % injection 3 mL (not administered)  sodium chloride flush (NS) 0.9 % injection 3 mL (not administered)  0.9 %  sodium chloride infusion (not administered)  diltiazem (CARDIZEM) tablet 30 mg (not administered)  diltiazem (CARDIZEM) injection 10 mg (10 mg Intravenous Given 01/25/18 1741)     NEW OUTPATIENT MEDICATIONS STARTED DURING THIS VISIT:  Current Discharge Medication List      Note:  This note was prepared with assistance of Dragon voice recognition software. Occasional wrong-word or sound-a-like substitutions may have occurred due to the inherent limitations of voice recognition software.   Legacy Lacivita, Barbara CowerJason, MD 01/26/18 0003

## 2018-01-26 ENCOUNTER — Observation Stay (HOSPITAL_BASED_OUTPATIENT_CLINIC_OR_DEPARTMENT_OTHER): Payer: Medicare Other

## 2018-01-26 DIAGNOSIS — I4891 Unspecified atrial fibrillation: Secondary | ICD-10-CM | POA: Diagnosis not present

## 2018-01-26 DIAGNOSIS — I361 Nonrheumatic tricuspid (valve) insufficiency: Secondary | ICD-10-CM | POA: Diagnosis not present

## 2018-01-26 DIAGNOSIS — I371 Nonrheumatic pulmonary valve insufficiency: Secondary | ICD-10-CM

## 2018-01-26 DIAGNOSIS — I5031 Acute diastolic (congestive) heart failure: Secondary | ICD-10-CM | POA: Diagnosis not present

## 2018-01-26 DIAGNOSIS — I340 Nonrheumatic mitral (valve) insufficiency: Secondary | ICD-10-CM | POA: Diagnosis not present

## 2018-01-26 DIAGNOSIS — I10 Essential (primary) hypertension: Secondary | ICD-10-CM | POA: Diagnosis not present

## 2018-01-26 LAB — URINALYSIS, ROUTINE W REFLEX MICROSCOPIC
BILIRUBIN URINE: NEGATIVE
Bacteria, UA: NONE SEEN
Glucose, UA: NEGATIVE mg/dL
Hgb urine dipstick: NEGATIVE
KETONES UR: 5 mg/dL — AB
Nitrite: NEGATIVE
PH: 7 (ref 5.0–8.0)
PROTEIN: NEGATIVE mg/dL
Specific Gravity, Urine: 1.009 (ref 1.005–1.030)

## 2018-01-26 LAB — BASIC METABOLIC PANEL
Anion gap: 10 (ref 5–15)
BUN: 14 mg/dL (ref 6–20)
CALCIUM: 9 mg/dL (ref 8.9–10.3)
CO2: 26 mmol/L (ref 22–32)
CREATININE: 0.77 mg/dL (ref 0.44–1.00)
Chloride: 105 mmol/L (ref 101–111)
GFR calc Af Amer: >60 mL/min (ref >60)
GLUCOSE: 114 mg/dL — AB (ref 65–99)
Potassium: 3.2 mmol/L — ABNORMAL LOW (ref 3.5–5.1)
Sodium: 141 mmol/L (ref 135–145)

## 2018-01-26 LAB — CBC
HEMATOCRIT: 33 % — AB (ref 36.0–46.0)
Hemoglobin: 10.7 g/dL — ABNORMAL LOW (ref 12.0–15.0)
MCH: 29.5 pg (ref 26.0–34.0)
MCHC: 32.4 g/dL (ref 30.0–36.0)
MCV: 90.9 fL (ref 78.0–100.0)
PLATELETS: 125 10*3/uL — AB (ref 150–400)
RBC: 3.63 MIL/uL — ABNORMAL LOW (ref 3.87–5.11)
RDW: 14.4 % (ref 11.5–15.5)
WBC: 9.7 10*3/uL (ref 4.0–10.5)

## 2018-01-26 LAB — TSH: TSH: 1.818 u[IU]/mL (ref 0.350–4.500)

## 2018-01-26 LAB — ECHOCARDIOGRAM COMPLETE
Height: 63 in
Weight: 2516.8 oz

## 2018-01-26 LAB — MAGNESIUM: Magnesium: 1.8 mg/dL (ref 1.7–2.4)

## 2018-01-26 MED ORDER — MAGNESIUM SULFATE 2 GM/50ML IV SOLN
2.0000 g | Freq: Once | INTRAVENOUS | Status: AC
Start: 2018-01-26 — End: 2018-01-26
  Administered 2018-01-26: 2 g via INTRAVENOUS
  Filled 2018-01-26: qty 50

## 2018-01-26 MED ORDER — BENAZEPRIL HCL 10 MG PO TABS
10.0000 mg | ORAL_TABLET | Freq: Every day | ORAL | Status: DC
  Administered 2018-01-27: 10 mg via ORAL
  Filled 2018-01-26: qty 1

## 2018-01-26 MED ORDER — POTASSIUM CHLORIDE CRYS ER 20 MEQ PO TBCR
40.0000 meq | EXTENDED_RELEASE_TABLET | Freq: Once | ORAL | Status: AC
  Administered 2018-01-26: 40 meq via ORAL
  Filled 2018-01-26: qty 2

## 2018-01-26 NOTE — Discharge Instructions (Signed)

## 2018-01-26 NOTE — Progress Notes (Signed)
  Echocardiogram 2D Echocardiogram has been performed.  Janalyn HarderWest, Annielee Jemmott R 01/26/2018, 3:06 PM

## 2018-01-26 NOTE — Progress Notes (Addendum)
PROGRESS NOTE    Samantha Ageeula E Mckinney  MVH:[id number] DOB: 1927/09/20 DOA: 01/25/2018 PCP: Patient, No Pcp Per   Outpatient Specialists:     Brief Narrative:  Samantha [name] is a 84 y.o. female with medical history significant of HTN and kidney stones only.  Patient presents to the ED with palpitations.  Intermittently having palpitations including yesterday.  Had again this morning, wasn't feeling well, went to UC, found to have A.Fib RVR and sent in to ED.   Addendum: echo shows a severely dilated LA -- causing a fib?-- BP low and still volume overloaded, most likely will need changes in regimen-- cardiology consult   Assessment & Plan:   Principal Problem:   Atrial fibrillation with RVR (HCC) Active Problems:   Acute diastolic CHF (congestive heart failure) (HCC)   HTN (hypertension)   A fib with RVR -rate controlled now -cardizem 30 mg q 6 hours -coreg -eliquis -will need outpatient cardiology-- prefers [facility] where her daughter goes -TSH pending -decrease ACE, holding norvasc  ?UTI -urinalysis pending -culture pending as well  Acute diastolic CHF -?from rate -BNP elevated -echo pending   DVT prophylaxis:  Fully anticoagulated   Code Status: Full Code   Family Communication:   Disposition Plan:     Consultants:      Subjective: No chest pain Still unable to lay flat  Objective: Vitals:   01/25/18 2100 01/26/18 0022 01/26/18 0325 01/26/18 0622  BP: 118/80 106/72  104/63  Pulse: 89 (!) 110  75  Resp: 20   18  Temp: 98.5 F (36.9 C) 98.1 F (36.7 C)  98 F (36.7 C)  TempSrc: Oral Oral  Oral  SpO2: 93% 93%  93%  Weight: 71.8 kg (158 lb 3.2 oz)  71.4 kg (157 lb 4.8 oz)   Height: 5\' 3"  (1.6 m)       Intake/Output Summary (Last 24 hours) at 01/26/2018 1308 Last data filed at 01/26/2018 0840 Gross per 24 hour  Intake 120 ml  Output 550 ml  Net -430 ml   Filed Weights   01/25/18 2100 01/26/18 0325  Weight: 71.8 kg (158 lb 3.2 oz) 71.4 kg (157  lb 4.8 oz)    Examination:  General exam: Appears calm and comfortable  Respiratory system: no increase work of breathing Cardiovascular system: irr Gastrointestinal system: +BS, soft Central nervous system: Alert and oriented. Skin: No rashes, lesions or ulcers Psychiatry: Judgement and insight appear normal. Mood & affect appropriate.     Data Reviewed: I have personally reviewed following labs and imaging studies  CBC: Recent Labs  Lab 01/25/18 1437 01/26/18 0540  WBC 12.0* 9.7  HGB 12.6 10.7*  HCT 38.4 33.0*  MCV 91.2 90.9  PLT 140* 125*   Basic Metabolic Panel: Recent Labs  Lab 01/25/18 1437 01/26/18 0540  NA 141 141  K 3.5 3.2*  CL 102 105  CO2 26 26  GLUCOSE 152* 114*  BUN 11 14  CREATININE 0.75 0.77  CALCIUM 9.9 9.0  MG  --  1.8   GFR: Estimated Creatinine Clearance: 46 mL/min (by C-G formula based on SCr of 0.77 mg/dL). Liver Function Tests: No results for input(s): AST, ALT, ALKPHOS, BILITOT, PROT, ALBUMIN in the last 168 hours. No results for input(s): LIPASE, AMYLASE in the last 168 hours. No results for input(s): AMMONIA in the last 168 hours. Coagulation Profile: No results for input(s): INR, PROTIME in the last 168 hours. Cardiac Enzymes: Recent Labs  Lab 01/25/18 1743  TROPONINI <0.03  BNP (last 3 results) No results for input(s): PROBNP in the last 8760 hours. HbA1C: No results for input(s): HGBA1C in the last 72 hours. CBG: No results for input(s): GLUCAP in the last 168 hours. Lipid Profile: No results for input(s): CHOL, HDL, LDLCALC, TRIG, CHOLHDL, LDLDIRECT in the last 72 hours. Thyroid Function Tests: No results for input(s): TSH, T4TOTAL, FREET4, T3FREE, THYROIDAB in the last 72 hours. Anemia Panel: No results for input(s): VITAMINB12, FOLATE, FERRITIN, TIBC, IRON, RETICCTPCT in the last 72 hours. Urine analysis: No results found for: COLORURINE, APPEARANCEUR, LABSPEC, PHURINE, GLUCOSEU, HGBUR, BILIRUBINUR, KETONESUR,  PROTEINUR, UROBILINOGEN, NITRITE, LEUKOCYTESUR   )No results found for this or any previous visit (from the past 240 hour(s)).    Anti-infectives (From admission, onward)   None       Radiology Studies: Dg Chest 2 View  Result Date: 01/25/2018 CLINICAL DATA:  Atrial fibrillation. EXAM: CHEST - 2 VIEW COMPARISON:  Chest x-ray dated January 03, 2006. FINDINGS: The heart size and mediastinal contours are within normal limits. Increased interstitial markings bilaterally. The lungs are hyperinflated. Scarring/atelectasis at the left lung base. No focal consolidation, pleural effusion, or pneumothorax. No acute osseous abnormality. Unchanged severe degenerative disc disease at the thoracolumbar junction. IMPRESSION: 1. Mild interstitial pulmonary edema. Electronically Signed   By: Obie Dredge M.D.   On: 01/25/2018 15:00        Scheduled Meds: . apixaban  5 mg Oral BID  . [START ON 01/27/2018] benazepril  10 mg Oral Daily  . carvedilol  25 mg Oral BID  . cholecalciferol  3,000 Units Oral Daily  . diltiazem  30 mg Oral Q6H  . latanoprost  1 drop Both Eyes QHS  . pantoprazole  40 mg Oral Daily  . potassium chloride  40 mEq Oral Once  . sodium chloride flush  3 mL Intravenous Q12H   Continuous Infusions: . sodium chloride       LOS: 0 days    Time spent: 35 min    Joseph Art, DO [facility] Pager [phone]  If 7PM-7AM, please contact night-coverage [email or web address] Password [email or web address] 01/26/2018, 1:08 PM

## 2018-01-26 NOTE — Plan of Care (Signed)
  Pain Managment: General experience of comfort will improve 01/26/2018 2013 - Progressing by Carolanne GrumblingWall, Itai Barbian C, RN Pt. States she feels better. No pain medication needed at this time.    Safety: Ability to remain free from injury will improve 01/26/2018 2013 - Progressing by Carolanne GrumblingWall, Nivin Braniff C, RN

## 2018-01-26 NOTE — Plan of Care (Signed)
  Progressing Education: Knowledge of disease or condition will improve 01/26/2018 0834 - Progressing by Olive BassFutrell, Michayla Mcneil E, RN Understanding of medication regimen will improve 01/26/2018 0834 - Progressing by Olive BassFutrell, Chakita Mcgraw E, RN Activity: Ability to tolerate increased activity will improve 01/26/2018 0834 - Progressing by Olive BassFutrell, Nai Dasch E, RN Cardiac: Ability to achieve and maintain adequate cardiopulmonary perfusion will improve 01/26/2018 0834 - Progressing by Olive BassFutrell, Alexyss Balzarini E, RN

## 2018-01-27 DIAGNOSIS — Z91048 Other nonmedicinal substance allergy status: Secondary | ICD-10-CM | POA: Diagnosis not present

## 2018-01-27 DIAGNOSIS — H548 Legal blindness, as defined in USA: Secondary | ICD-10-CM | POA: Diagnosis present

## 2018-01-27 DIAGNOSIS — I4891 Unspecified atrial fibrillation: Secondary | ICD-10-CM | POA: Diagnosis present

## 2018-01-27 DIAGNOSIS — I071 Rheumatic tricuspid insufficiency: Secondary | ICD-10-CM | POA: Diagnosis present

## 2018-01-27 DIAGNOSIS — E8770 Fluid overload, unspecified: Secondary | ICD-10-CM | POA: Diagnosis not present

## 2018-01-27 DIAGNOSIS — Z8249 Family history of ischemic heart disease and other diseases of the circulatory system: Secondary | ICD-10-CM | POA: Diagnosis not present

## 2018-01-27 DIAGNOSIS — Z9181 History of falling: Secondary | ICD-10-CM | POA: Diagnosis not present

## 2018-01-27 DIAGNOSIS — Z79899 Other long term (current) drug therapy: Secondary | ICD-10-CM | POA: Diagnosis not present

## 2018-01-27 DIAGNOSIS — I10 Essential (primary) hypertension: Secondary | ICD-10-CM | POA: Diagnosis not present

## 2018-01-27 DIAGNOSIS — I5031 Acute diastolic (congestive) heart failure: Secondary | ICD-10-CM | POA: Diagnosis present

## 2018-01-27 DIAGNOSIS — I110 Hypertensive heart disease with heart failure: Secondary | ICD-10-CM | POA: Diagnosis present

## 2018-01-27 LAB — BASIC METABOLIC PANEL
Anion gap: 8 (ref 5–15)
BUN: 16 mg/dL (ref 6–20)
CALCIUM: 8.6 mg/dL — AB (ref 8.9–10.3)
CHLORIDE: 107 mmol/L (ref 101–111)
CO2: 26 mmol/L (ref 22–32)
CREATININE: 0.8 mg/dL (ref 0.44–1.00)
Glucose, Bld: 92 mg/dL (ref 65–99)
Potassium: 4 mmol/L (ref 3.5–5.1)
SODIUM: 141 mmol/L (ref 135–145)

## 2018-01-27 LAB — CBC
HCT: 34.3 % — ABNORMAL LOW (ref 36.0–46.0)
HEMOGLOBIN: 11.2 g/dL — AB (ref 12.0–15.0)
MCH: 29.9 pg (ref 26.0–34.0)
MCHC: 32.7 g/dL (ref 30.0–36.0)
MCV: 91.7 fL (ref 78.0–100.0)
PLATELETS: 122 10*3/uL — AB (ref 150–400)
RBC: 3.74 MIL/uL — ABNORMAL LOW (ref 3.87–5.11)
RDW: 14.5 % (ref 11.5–15.5)
WBC: 7.9 10*3/uL (ref 4.0–10.5)

## 2018-01-27 LAB — URINE CULTURE

## 2018-01-27 MED ORDER — DILTIAZEM HCL ER COATED BEADS 120 MG PO CP24
120.0000 mg | ORAL_CAPSULE | Freq: Every day | ORAL | Status: DC
  Administered 2018-01-27 – 2018-01-28 (×2): 120 mg via ORAL
  Filled 2018-01-27 (×2): qty 1

## 2018-01-27 NOTE — Consult Note (Addendum)
Cardiology Consultation:   Patient ID: Samantha Ageeula E Kreh; [id number]; 1927/03/20   Admit date: 01/25/2018 Date of Consult: 01/27/2018  Primary Care Provider: Patient, No Pcp Per Primary Cardiologist: New to Dr. Antoine PocheHochrein    Patient Profile:   Samantha [name] is a 84 y.o. female with a hx of hypertension who is being seen today for the evaluation of atrial fibrillation at the request of Dr. Benjamine MolaVann.  No prior cardiac history.  History of Present Illness:   Samantha [name] was in usual state of health up until Saturday evening when she started to having palpitations. No associated sob, chest tightness or dizziness. She did felt fatigue however she feels like this whenever she has kidney stone. She went to urgent care found to have afib RVR and possible UTI. She was given rx of  nitrofurantoin and sent ER for management of afib RVR.   In ER noted HR of 144 that improved to 100s after IV Cardizem 10mg  x 1. She was admitted and started on PO Cardizem short acting. HR stable in 90s. BNP 663. HGB 11.2. Electrolytes and kidney function stable.  TSH normal.  Daughter has history of atrial fibrillation and pacemaker.  Denies prior history of tobacco smoking or alcohol abuse.  [place] independently [place] living [place]  Past Medical History:  Diagnosis Date  . Hypertension   . Kidney stones, calcium oxalate     History reviewed. No pertinent surgical history.  Inpatient Medications: Scheduled Meds: . apixaban  5 mg Oral BID  . benazepril  10 mg Oral Daily  . carvedilol  25 mg Oral BID  . cholecalciferol  3,000 Units Oral Daily  . diltiazem  30 mg Oral Q6H  . latanoprost  1 drop Both Eyes QHS  . pantoprazole  40 mg Oral Daily  . sodium chloride flush  3 mL Intravenous Q12H   Continuous Infusions: . sodium chloride     PRN Meds: sodium chloride, acetaminophen, ondansetron (ZOFRAN) IV, sodium chloride flush  Allergies:    Allergies  Allergen Reactions  . Tape  Rash    Please use paper tape    Social History:   Social History   Socioeconomic History  . Marital status: Widowed    Spouse name: Not on file  . Number of children: Not on file  . Years of education: Not on file  . Highest education level: Not on file  Social Needs  . Financial resource strain: Not on file  . Food insecurity - worry: Not on file  . Food insecurity - inability: Not on file  . Transportation needs - medical: Not on file  . Transportation needs - non-medical: Not on file  Occupational History  . Not on file  Tobacco Use  . Smoking status: Never Smoker  Substance and Sexual Activity  . Alcohol use: No    Frequency: Never  . Drug use: No  . Sexual activity: Not on file  Other Topics Concern  . Not on file  Social History Narrative  . Not on file    Family History:    Family History  Problem Relation Age of Onset  . Heart disease Mother   . Heart disease Father      ROS:  Please see the history of present illness.  All other ROS reviewed and negative.     Physical Exam/Data:   Vitals:   01/26/18 2031 01/27/18 0004 01/27/18 0436 01/27/18 1109  BP: 106/71 101/80 118/66 (!) 110/58  Pulse:  62  82 (!) 48  Resp: (!) 24  (!) 22 18  Temp: 98.1 F (36.7 C)  98.4 F (36.9 C) (!) 97.3 F (36.3 C)  TempSrc: Oral  Tympanic Oral  SpO2: 94%  95% 96%  Weight:   157 lb 6.4 oz (71.4 kg)   Height:        Intake/Output Summary (Last 24 hours) at 01/27/2018 1341 Last data filed at 01/27/2018 1100 Gross per 24 hour  Intake 650 ml  Output 1327 ml  Net -677 ml   Filed Weights   01/25/18 2100 01/26/18 0325 01/27/18 0436  Weight: 158 lb 3.2 oz (71.8 kg) 157 lb 4.8 oz (71.4 kg) 157 lb 6.4 oz (71.4 kg)   Body mass index is 27.88 kg/m.  General:  Well nourished, well developed, in no acute distress HEENT: normal Lymph: no adenopathy Neck: no JVD Endocrine:  No thryomegaly Vascular: No carotid bruits; FA pulses 2+ bilaterally without bruits  Cardiac:   normal S1, S2; irregularly irregular; no murmur  Lungs:  clear to auscultation bilaterally, no wheezing, rhonchi or rales  Abd: soft, nontender, no hepatomegaly  Ext: no edema Musculoskeletal:  No deformities, BUE and BLE strength normal and equal Skin: warm and dry  Neuro:  CNs 2-12 intact, no focal abnormalities noted Psych:  Normal affect   EKG:  The EKG was personally reviewed and demonstrates: Atrial fibrillation at rate of 142 bpm Telemetry:  Telemetry was personally reviewed and demonstrates: Atrial fibrillation at rate of 90s  Relevant CV Studies: Echo 01/26/18 Study Conclusions  - Left ventricle: The cavity size was normal. Wall thickness was   increased in a pattern of mild LVH. Systolic function was normal.   The estimated ejection fraction was in the range of 55% to 60%.   Wall motion was normal; there were no regional wall motion   abnormalities. The study is not technically sufficient to allow   evaluation of LV diastolic function. Ejection fraction (MOD,   2-plane): 55%. - Aortic valve: Trileaflet. Sclerosis without stenosis. There was   trivial regurgitation. - Mitral valve: Mildly thickened leaflets . There was mild   regurgitation. - Left atrium: Severely dilated. - Right atrium: The atrium was mildly dilated. - Atrial septum: Thickened and calcified - ?prior closure or   amplatzer device. Negative for PFO. - Tricuspid valve: There was moderate regurgitation. - Pulmonic valve: Mildly thickened leaflets. There was mild   regurgitation. - Pulmonary arteries: PA peak pressure: 49 mm Hg (S). - Inferior vena cava: The vessel was dilated. The respirophasic   diameter changes were blunted (< 50%), consistent with elevated   central venous pressure.  Impressions:  - LVEF 55-60%, mild LVH, normal wall motion, trileaflet sclerotic   aortic valve, mild AI, mild MR, severe LAE, mild RAE, thickened   interatrial septum - ?prior closure, negative for PFO, moderate    TR, mild PI, RVSP 49 mmHg, dilated IVC.  Laboratory Data:  Chemistry Recent Labs  Lab 01/25/18 1437 01/26/18 0540 01/27/18 0400  NA 141 141 141  K 3.5 3.2* 4.0  CL 102 105 107  CO2 26 26 26   GLUCOSE 152* 114* 92  BUN 11 14 16   CREATININE 0.75 0.77 0.80  CALCIUM 9.9 9.0 8.6*  GFRNONAA >60 >60 >60  GFRAA >60 >60 >60  ANIONGAP 13 10 8     Hematology Recent Labs  Lab 01/25/18 1437 01/26/18 0540 01/27/18 0400  WBC 12.0* 9.7 7.9  RBC 4.21 3.63* 3.74*  HGB 12.6 10.7* 11.2*  HCT 38.4 33.0* 34.3*  MCV 91.2 90.9 91.7  MCH 29.9 29.5 29.9  MCHC 32.8 32.4 32.7  RDW 14.3 14.4 14.5  PLT 140* 125* 122*   Cardiac Enzymes Recent Labs  Lab 01/25/18 1743  TROPONINI <0.03   No results for input(s): TROPIPOC in the last 168 hours.  BNP Recent Labs  Lab 01/25/18 1743  BNP 663.0*    DDimer No results for input(s): DDIMER in the last 168 hours.  Radiology/Studies:  Dg Chest 2 View  Result Date: 01/25/2018 CLINICAL DATA:  Atrial fibrillation. EXAM: CHEST - 2 VIEW COMPARISON:  Chest x-ray dated January 03, 2006. FINDINGS: The heart size and mediastinal contours are within normal limits. Increased interstitial markings bilaterally. The lungs are hyperinflated. Scarring/atelectasis at the left lung base. No focal consolidation, pleural effusion, or pneumothorax. No acute osseous abnormality. Unchanged severe degenerative disc disease at the thoracolumbar junction. IMPRESSION: 1. Mild interstitial pulmonary edema. Electronically Signed   By: Obie Dredge M.D.   On: 01/25/2018 15:00    Assessment and Plan:   1. New onset atrial fibrillation -Palpitations started Saturday evening.  Heart rate controlled to 90s on Cardizem 30 mg every 6 hours and Coreg 25 mg twice daily. CHADSVASC score of 4 for age, sex and HTN, prior history of mechanical falls multiple times.  Last one about a year ago.  Now using walker. -Consider cardioversion after 3 weeks of anticoagulation if still remains in  atrial fibrillation.  2.  Hypertension -Blood pressure soft low on current medication.  Consider consolidation of Cardizem to long acting at 180 mg daily (for better rate )and reducing benazepril.   For questions or updates, please contact [facility] Please consult [email or web address] for contact info under Cardiology/STEMI.   Lorelei Pont, [place]  01/27/2018 1:41 PM   History and all data above reviewed.  Patient examined.  I agree with the findings as above.  The patient noticed palpitations and had weakness.  she is unclear how long.  The patient denies any new symptoms such as chest discomfort, neck or arm discomfort. There has been no new shortness of breath, PND or orthopnea. There has been no presyncope or syncope.   She is legally blind and lives in independent living.   The patient exam reveals [id number]  ,  Lungs: Clear  ,  Abd: Positive bowel sounds, no rebound no guarding, Ext No edema  .  All available labs, radiology testing, previous records reviewed. Agree with documented assessment and plan. Atrial fib:  The patient will be treated with Cardizem/Coreg and Eliquis.  Likely DCCV in 3 weeks.  Discussed via phone with her daughter.   Fayrene Fearing Avyan Livesay  4:01 PM  01/27/2018

## 2018-01-27 NOTE — Progress Notes (Signed)
PROGRESS NOTE    Samantha Ageeula E Boule  JXB:[id number] DOB: 1927/09/16 DOA: 01/25/2018 PCP: Patient, No Pcp Per   Outpatient Specialists:     Brief Narrative:  Samantha [name] is a 84 y.o. female with medical history significant of HTN and kidney stones only.  Patient presents to the ED with palpitations.  Intermittently having palpitations including yesterday.  Had again this morning, wasn't feeling well, went to UC, found to have A.Fib RVR and sent in to ED.     Assessment & Plan:   Principal Problem:   Atrial fibrillation with RVR (HCC) Active Problems:   Acute diastolic CHF (congestive heart failure) (HCC)   HTN (hypertension)   A fib with RVR -rate controlled now -cardizem 30 mg q 6 hours -coreg -eliquis -TSH pending -decrease ACE, holding norvasc -if BP stable in AM plan to d/c back to ILF  ?UTI- do not think she has this-- no symptoms -urinalysis normal -culture shows multiple species  Acute diastolic CHF -?from rate -BNP elevated -echo done   DVT prophylaxis:  Fully anticoagulated   Code Status: Full Code   Family Communication: Called daughter  Disposition Plan:     Consultants:      Subjective: No chest pain Still unable to lay flat  Objective: Vitals:   01/27/18 0004 01/27/18 0436 01/27/18 1109 01/27/18 1601  BP: 101/80 118/66 (!) 110/58 126/73  Pulse:  82 (!) 48 83  Resp:  (!) 22 18 18   Temp:  98.4 F (36.9 C) (!) 97.3 F (36.3 C) 97.8 F (36.6 C)  TempSrc:  Tympanic Oral Oral  SpO2:  95% 96% 95%  Weight:  71.4 kg (157 lb 6.4 oz)    Height:        Intake/Output Summary (Last 24 hours) at 01/27/2018 1610 Last data filed at 01/27/2018 1608 Gross per 24 hour  Intake 650 ml  Output 1127 ml  Net -477 ml   Filed Weights   01/25/18 2100 01/26/18 0325 01/27/18 0436  Weight: 71.8 kg (158 lb 3.2 oz) 71.4 kg (157 lb 4.8 oz) 71.4 kg (157 lb 6.4 oz)    Examination:  General exam: NAD Respiratory system: no wheezing Cardiovascular  system: irr but rate controlled Gastrointestinal system: +BS, soft Central nervous system: alert     Data Reviewed: I have personally reviewed following labs and imaging studies  CBC: Recent Labs  Lab 01/25/18 1437 01/26/18 0540 01/27/18 0400  WBC 12.0* 9.7 7.9  HGB 12.6 10.7* 11.2*  HCT 38.4 33.0* 34.3*  MCV 91.2 90.9 91.7  PLT 140* 125* 122*   Basic Metabolic Panel: Recent Labs  Lab 01/25/18 1437 01/26/18 0540 01/27/18 0400  NA 141 141 141  K 3.5 3.2* 4.0  CL 102 105 107  CO2 26 26 26   GLUCOSE 152* 114* 92  BUN 11 14 16   CREATININE 0.75 0.77 0.80  CALCIUM 9.9 9.0 8.6*  MG  --  1.8  --    GFR: Estimated Creatinine Clearance: 46 mL/min (by C-G formula based on SCr of 0.8 mg/dL). Liver Function Tests: No results for input(s): AST, ALT, ALKPHOS, BILITOT, PROT, ALBUMIN in the last 168 hours. No results for input(s): LIPASE, AMYLASE in the last 168 hours. No results for input(s): AMMONIA in the last 168 hours. Coagulation Profile: No results for input(s): INR, PROTIME in the last 168 hours. Cardiac Enzymes: Recent Labs  Lab 01/25/18 1743  TROPONINI <0.03   BNP (last 3 results) No results for input(s): PROBNP in the last 8760 hours.  HbA1C: No results for input(s): HGBA1C in the last 72 hours. CBG: No results for input(s): GLUCAP in the last 168 hours. Lipid Profile: No results for input(s): CHOL, HDL, LDLCALC, TRIG, CHOLHDL, LDLDIRECT in the last 72 hours. Thyroid Function Tests: Recent Labs    01/26/18 1343  TSH 1.818   Anemia Panel: No results for input(s): VITAMINB12, FOLATE, FERRITIN, TIBC, IRON, RETICCTPCT in the last 72 hours. Urine analysis:    Component Value Date/Time   COLORURINE YELLOW 01/26/2018 0800   APPEARANCEUR CLEAR 01/26/2018 0800   LABSPEC 1.009 01/26/2018 0800   PHURINE 7.0 01/26/2018 0800   GLUCOSEU NEGATIVE 01/26/2018 0800   HGBUR NEGATIVE 01/26/2018 0800   BILIRUBINUR NEGATIVE 01/26/2018 0800   KETONESUR 5 (A) 01/26/2018  0800   PROTEINUR NEGATIVE 01/26/2018 0800   NITRITE NEGATIVE 01/26/2018 0800   LEUKOCYTESUR SMALL (A) 01/26/2018 0800     ) Recent Results (from the past 240 hour(s))  Culture, Urine     Status: Abnormal   Collection Time: 01/25/18  8:58 PM  Result Value Ref Range Status   Specimen Description URINE, RANDOM  Final   Special Requests   Final    NONE Performed at [facility], [address], [place], [place] [address]    Culture MULTIPLE SPECIES PRESENT, SUGGEST RECOLLECTION (A)  Final   Report Status 01/27/2018 FINAL  Final      Anti-infectives (From admission, onward)   None       Radiology Studies: No results found.      Scheduled Meds: . apixaban  5 mg Oral BID  . carvedilol  25 mg Oral BID  . cholecalciferol  3,000 Units Oral Daily  . diltiazem  120 mg Oral Daily  . latanoprost  1 drop Both Eyes QHS  . pantoprazole  40 mg Oral Daily  . sodium chloride flush  3 mL Intravenous Q12H   Continuous Infusions: . sodium chloride       LOS: 0 days    Time spent: 25 min    Joseph Art, DO [facility] Pager [phone]  If 7PM-7AM, please contact night-coverage [email or web address] Password [email or web address] 01/27/2018, 4:10 PM

## 2018-01-27 NOTE — Care Management Obs Status (Signed)
MEDICARE OBSERVATION STATUS NOTIFICATION   Patient Details  Name: Samantha [name] MRN: [id number] Date of Birth: October 16, 1927   Medicare Observation Status Notification Given:  Yes    Lawerance Sabalebbie Herberto Ledwell, RN 01/27/2018, 10:04 AM

## 2018-01-27 NOTE — Progress Notes (Addendum)
Benefit check in progress for Eliquis / Lucienne CapersXarelto; B Chaquita Basques RN,MHA,BSN [phone]  RE: Benefit check  Received: Today  Message Contents  Mardene SayerGreenlee, Dora CMA        # 9.  S/W ALEX @ [employer] # [phone] OPT- 2    1. XARELTO 20 MG DAILY  COVER- YES  CO-PAY- $ 34.00  TIER- 3 DRUG  PRIOR APPROVAL- NO    2. XARELTO 15 MG BID  COVER- YES  CO-PAY- $ 34.00  TIER- 3 DRUG  PRIOR APPROVAL- NO    3. ELIQUIS 5 MG BID  COVER- YES  CO-PAY- $ 34.00  TIER- 3 DRUG  PRIOR APPROVAL- NO    4. ELIQUIS 2.5 MG BID  COVER- YES  CO-PAY- $ 34.00  TIER- 3 DRUG  PRIOR APPROVAL- NO   PREFERRED PHARMACY : [facility] , [facility] AND [facility]

## 2018-01-27 NOTE — Evaluation (Signed)
Physical Therapy Evaluation Patient Details Name: Samantha [name] MRN: [id number] DOB: October 07, 1927 Today's Date: 01/27/2018   History of Present Illness  Pt is an 84 y.o. female admitted 01/25/18 with c/o intermittent palpitations; found to have a-fib with RVR and possible UTI. CXR shows mild pulmonary edema. PMH includes HTN, CHF.    Clinical Impression  Patient evaluated by Physical Therapy with no further acute PT needs identified. PTA, pt lives alone in apartment at independent living facility where meals and housekeeping are provided; due to legal blindness, pt typically stays in room where she is mod indep with rollator for amb and ADLs; if she leaves room, always has assist from facility employee or family member.Today, pt able to amb with RW and supervision due to unfamiliar environment. All education has been completed and the patient has no further questions. PT is signing off. Thank you for this referral.    Follow Up Recommendations No PT follow up;Supervision - Intermittent    Equipment Recommendations  None recommended by PT    Recommendations for Other Services       Precautions / Restrictions Precautions Precautions: Fall Precaution Comments: Legally blind (can see rough objects and colors) Restrictions Weight Bearing Restrictions: No      Mobility  Bed Mobility Overal bed mobility: Independent             General bed mobility comments: Received sitting EOB  Transfers Overall transfer level: Modified independent Equipment used: Rolling walker (2 wheeled)                Ambulation/Gait Ambulation/Gait assistance: Supervision Ambulation Distance (Feet): 120 Feet Assistive device: Rolling walker (2 wheeled) Gait Pattern/deviations: Step-through pattern;Decreased stride length;Trunk flexed Gait velocity: Decreased   General Gait Details: Slow, controlled amb with RW and supervision for safety due to amb in unfamiliar environment. Pt navigated objects  in hallway well despite level of blindness  Stairs            Wheelchair Mobility    Modified Rankin (Stroke Patients Only)       Balance Overall balance assessment: Needs assistance   Sitting balance-Leahy Scale: Good Sitting balance - Comments: Indep to don shoes sitting EOB       Standing balance comment: Can static stand with no UE support; dynamic stability improved with RW                             Pertinent Vitals/Pain Pain Assessment: No/denies pain    Home Living Family/patient expects to be discharged to:: Assisted living Living Arrangements: Alone             Home Equipment: Walker - 4 wheels;Cane - single point;Grab bars - toilet;Grab bars - tub/shower;Shower seat Additional Comments: Lives in 2-room apartment at independent living facility    Prior Function Level of Independence: Independent with assistive device(s)         Comments: Meals provided and brought to room. Pt legally blind so typically stays in room where she uses rollator. If leaves room, always has assist from someone. Children live nearby     Hand Dominance        Extremity/Trunk Assessment   Upper Extremity Assessment Upper Extremity Assessment: Overall WFL for tasks assessed    Lower Extremity Assessment Lower Extremity Assessment: Overall WFL for tasks assessed    Cervical / Trunk Assessment Cervical / Trunk Assessment: Kyphotic  Communication   Communication: HOH  Cognition Arousal/Alertness: Awake/alert Behavior  During Therapy: WFL for tasks assessed/performed Overall Cognitive Status: Within Functional Limits for tasks assessed                                        General Comments      Exercises     Assessment/Plan    PT Assessment Patent does not need any further PT services  PT Problem List         PT Treatment Interventions      PT Goals (Current goals can be found in the Care Plan section)  Acute Rehab PT  Goals PT Goal Formulation: All assessment and education complete, DC therapy    Frequency     Barriers to discharge        Co-evaluation               AM-PAC PT "6 Clicks" Daily Activity  Outcome Measure Difficulty turning over in bed (including adjusting bedclothes, sheets and blankets)?: None Difficulty moving from lying on back to sitting on the side of the bed? : None Difficulty sitting down on and standing up from a chair with arms (e.g., wheelchair, bedside commode, etc,.)?: None Help needed moving to and from a bed to chair (including a wheelchair)?: None Help needed walking in hospital room?: A Little Help needed climbing 3-5 steps with a railing? : A Little 6 Click Score: 22    End of Session Equipment Utilized During Treatment: Gait belt Activity Tolerance: Patient tolerated treatment well Patient left: in chair;with call bell/phone within reach Nurse Communication: Mobility status PT Visit Diagnosis: Other abnormalities of gait and mobility (R26.89)    Time: 8119-1478 PT Time Calculation (min) (ACUTE ONLY): 24 min   Charges:   PT Evaluation $PT Eval Moderate Complexity: 1 Mod PT Treatments $Gait Training: 8-22 mins   PT G Codes:       Samantha [name], PT, DPT Acute Rehab Services  Pager: [phone]  Malachy Chamber 01/27/2018, 8:45 AM

## 2018-01-28 DIAGNOSIS — E8770 Fluid overload, unspecified: Secondary | ICD-10-CM

## 2018-01-28 MED ORDER — DILTIAZEM HCL ER COATED BEADS 120 MG PO CP24: 120.0000 mg | ORAL_CAPSULE | Freq: Every day | ORAL | 0 refills | Status: DC

## 2018-01-28 MED ORDER — APIXABAN 5 MG PO TABS: 5.0000 mg | ORAL_TABLET | Freq: Two times a day (BID) | ORAL | 0 refills | Status: DC

## 2018-01-28 NOTE — Discharge Summary (Signed)
Physician Discharge Summary  Samantha Ageeula E Gum [id number] DOB: Nov 12, 1927 DOA: 01/25/2018  PCP: Hadley Penobbins, Robert A, MD  Admit date: 01/25/2018 Discharge date: 01/28/2018   Recommendations for Outpatient Follow-Up:   1. Close outpatient follow up for consideration of cardioversion 2. Cbc 1 week   Discharge Diagnosis:   Principal Problem:   Atrial fibrillation with RVR (HCC) Active Problems:   volume overload   HTN (hypertension)   Discharge disposition:  Home.  Discharge Condition: Improved.  Diet recommendation: Low sodium, heart healthy.  Carbohydrate-modified.  Regular.  Wound care: None.   History of Present Illness:   Samantha [name] is a 84 y.o. female with medical history significant of HTN and kidney stones only.  Patient presents to the ED with palpitations.  Intermittently having palpitations including yesterday.  Had again this morning, wasn't feeling well, went to UC, found to have A.Fib RVR and sent in to ED.      Hospital Course by Problem:   A fib with RVR -rate controlled now -cardizem 30 mg plus coreg -eliquis -d/c other BP meds  ?UTI- do not think she has this-- no symptoms -urinalysis normal -culture shows multiple species  Volume overload -echo unable to assess for diastolic dsfxn -?from rate -BNP elevated -no symptoms      Medical Consultants:    cards   Discharge Exam:   Vitals:   01/28/18 0500 01/28/18 0812  BP: 107/60 132/68  Pulse: 83 75  Resp: 18   Temp: (!) 97.4 F (36.3 C)   SpO2: 95% 96%   Vitals:   01/27/18 1611 01/27/18 2202 01/28/18 0500 01/28/18 0812  BP: 104/62 109/79 107/60 132/68  Pulse: 63 78 83 75  Resp: 18 18 18    Temp: 97.7 F (36.5 C) 97.9 F (36.6 C) (!) 97.4 F (36.3 C)   TempSrc: Oral Oral Oral   SpO2: 96% 95% 95% 96%  Weight:   71.5 kg (157 lb 9.6 oz)   Height:        Gen:  NAD   The results of significant diagnostics from this hospitalization (including imaging, microbiology,  ancillary and laboratory) are listed below for reference.     Procedures and Diagnostic Studies:   Dg Chest 2 View  Result Date: 01/25/2018 CLINICAL DATA:  Atrial fibrillation. EXAM: CHEST - 2 VIEW COMPARISON:  Chest x-ray dated January 03, 2006. FINDINGS: The heart size and mediastinal contours are within normal limits. Increased interstitial markings bilaterally. The lungs are hyperinflated. Scarring/atelectasis at the left lung base. No focal consolidation, pleural effusion, or pneumothorax. No acute osseous abnormality. Unchanged severe degenerative disc disease at the thoracolumbar junction. IMPRESSION: 1. Mild interstitial pulmonary edema. Electronically Signed   By: Obie DredgeWilliam T Derry M.D.   On: 01/25/2018 15:00     Labs:   Basic Metabolic Panel: Recent Labs  Lab 01/25/18 1437 01/26/18 0540 01/27/18 0400  NA 141 141 141  K 3.5 3.2* 4.0  CL 102 105 107  CO2 26 26 26   GLUCOSE 152* 114* 92  BUN 11 14 16   CREATININE 0.75 0.77 0.80  CALCIUM 9.9 9.0 8.6*  MG  --  1.8  --    GFR Estimated Creatinine Clearance: 46 mL/min (by C-G formula based on SCr of 0.8 mg/dL). Liver Function Tests: No results for input(s): AST, ALT, ALKPHOS, BILITOT, PROT, ALBUMIN in the last 168 hours. No results for input(s): LIPASE, AMYLASE in the last 168 hours. No results for input(s): AMMONIA in the last 168 hours. Coagulation profile No results  for input(s): INR, PROTIME in the last 168 hours.  CBC: Recent Labs  Lab 01/25/18 1437 01/26/18 0540 01/27/18 0400  WBC 12.0* 9.7 7.9  HGB 12.6 10.7* 11.2*  HCT 38.4 33.0* 34.3*  MCV 91.2 90.9 91.7  PLT 140* 125* 122*   Cardiac Enzymes: Recent Labs  Lab 01/25/18 1743  TROPONINI <0.03   BNP: Invalid input(s): POCBNP CBG: No results for input(s): GLUCAP in the last 168 hours. D-Dimer No results for input(s): DDIMER in the last 72 hours. Hgb A1c No results for input(s): HGBA1C in the last 72 hours. Lipid Profile No results for input(s):  CHOL, HDL, LDLCALC, TRIG, CHOLHDL, LDLDIRECT in the last 72 hours. Thyroid function studies Recent Labs    01/26/18 1343  TSH 1.818   Anemia work up No results for input(s): VITAMINB12, FOLATE, FERRITIN, TIBC, IRON, RETICCTPCT in the last 72 hours. Microbiology Recent Results (from the past 240 hour(s))  Culture, Urine     Status: Abnormal   Collection Time: 01/25/18  8:58 PM  Result Value Ref Range Status   Specimen Description URINE, RANDOM  Final   Special Requests   Final    NONE Performed at [facility], [address] [place], [place] [address]    Culture MULTIPLE SPECIES PRESENT, SUGGEST RECOLLECTION (A)  Final   Report Status 01/27/2018 FINAL  Final     Discharge Instructions:   Discharge Instructions    Diet - low sodium heart healthy   Complete by:  As directed    Increase activity slowly   Complete by:  As directed      Allergies as of 01/28/2018      Reactions   Tape Rash   Please use paper tape      Medication List    STOP taking these medications   amLODipine 10 MG tablet Commonly known as:  NORVASC   benazepril 40 MG tablet Commonly known as:  LOTENSIN     TAKE these medications   acetaminophen 500 MG tablet Commonly known as:  TYLENOL Take 500 mg by mouth every 6 (six) hours as needed for headache (pain).   apixaban 5 MG Tabs tablet Commonly known as:  ELIQUIS Take 1 tablet (5 mg total) by mouth 2 (two) times daily.   carvedilol 25 MG tablet Commonly known as:  COREG Take 25 mg by mouth 2 (two) times daily.   cholecalciferol 1000 units tablet Commonly known as:  VITAMIN D Take 3,000 Units by mouth daily.   D-Mannose Powd Take 5 mLs by mouth daily. Mix in 8 oz water and drink   diltiazem 120 MG 24 hr capsule Commonly known as:  CARDIZEM CD Take 1 capsule (120 mg total) by mouth daily. Start taking on:  01/29/2018   Fish Oil 1000 MG Caps Take 1,000 mg by mouth daily.   hyoscyamine 0.125 MG SL tablet Commonly known as:   LEVSIN SL Take 0.125 mg by mouth at bedtime.   ICAPS PO Take 1 capsule by mouth 2 (two) times daily.   latanoprost 0.005 % ophthalmic solution Commonly known as:  XALATAN Place 1 drop into both eyes at bedtime.   pantoprazole 40 MG tablet Commonly known as:  PROTONIX Take 40 mg by mouth daily.   SYSTANE OP Place 1 drop into both eyes 2 (two) times daily.      Follow-up Information    Hadley Pen, MD Follow up in 1 week(s).   Specialty:  Family Medicine Contact information: [address]  [place] [address] [phone]        Rollene Rotunda, MD Follow up.   Specialty:  Cardiology Why:  office will call with follow up Contact information: [address] [place] [address] [phone]            Time coordinating discharge: 35 min  Signed:  Joseph Art   [facility] 01/28/2018, 10:26 AM

## 2018-01-28 NOTE — Clinical Social Work Note (Signed)
CSW acknowledges consult that patient is from an ALF/ILF. According to PT evaluation, patient is from an independent living facility. They recommended no PT follow up. No social work needs at this time.  CSW signing off. Consult again if any social work needs arise.  Charlynn CourtSarah Kamren Heintzelman, CSW [phone]

## 2018-01-28 NOTE — Progress Notes (Signed)
Reviewed discharge instructions/medications with patient and patient's daughter. Answered their questions. Pt is stable and ready for discharge.

## 2018-01-28 NOTE — Progress Notes (Signed)
Progress Note  Patient Name: Samantha [name] Date of Encounter: 01/28/2018  Primary Cardiologist:   No primary care provider on file.   Subjective   No chest pain.  No presyncope.  No SOB.   Inpatient Medications    Scheduled Meds: . apixaban  5 mg Oral BID  . carvedilol  25 mg Oral BID  . cholecalciferol  3,000 Units Oral Daily  . diltiazem  120 mg Oral Daily  . latanoprost  1 drop Both Eyes QHS  . pantoprazole  40 mg Oral Daily  . sodium chloride flush  3 mL Intravenous Q12H   Continuous Infusions: . sodium chloride     PRN Meds: sodium chloride, acetaminophen, ondansetron (ZOFRAN) IV, sodium chloride flush   Vital Signs    Vitals:   01/27/18 1611 01/27/18 2202 01/28/18 0500 01/28/18 0812  BP: 104/62 109/79 107/60 132/68  Pulse: 63 78 83 75  Resp: 18 18 18    Temp: 97.7 F (36.5 C) 97.9 F (36.6 C) (!) 97.4 F (36.3 C)   TempSrc: Oral Oral Oral   SpO2: 96% 95% 95% 96%  Weight:   157 lb 9.6 oz (71.5 kg)   Height:        Intake/Output Summary (Last 24 hours) at 01/28/2018 0946 Last data filed at 01/28/2018 0550 Gross per 24 hour  Intake 680 ml  Output 950 ml  Net -270 ml   Filed Weights   01/26/18 0325 01/27/18 0436 01/28/18 0500  Weight: 157 lb 4.8 oz (71.4 kg) 157 lb 6.4 oz (71.4 kg) 157 lb 9.6 oz (71.5 kg)    Telemetry    Atrial fib with controlled rate - Personally Reviewed  ECG    NA - Personally Reviewed  Physical Exam   GEN: No acute distress.   Neck: No  JVD Cardiac: Irregular RR, no murmurs, rubs, or gallops.  Respiratory: Clear  to auscultation bilaterally. GI: Soft, nontender, non-distended  MS: No  edema; No deformity. Neuro:  Nonfocal  Psych: Normal affect   Labs    Chemistry Recent Labs  Lab 01/25/18 1437 01/26/18 0540 01/27/18 0400  NA 141 141 141  K 3.5 3.2* 4.0  CL 102 105 107  CO2 26 26 26   GLUCOSE 152* 114* 92  BUN 11 14 16   CREATININE 0.75 0.77 0.80  CALCIUM 9.9 9.0 8.6*  GFRNONAA >60 >60 >60  GFRAA >60 >60  >60  ANIONGAP 13 10 8      Hematology Recent Labs  Lab 01/25/18 1437 01/26/18 0540 01/27/18 0400  WBC 12.0* 9.7 7.9  RBC 4.21 3.63* 3.74*  HGB 12.6 10.7* 11.2*  HCT 38.4 33.0* 34.3*  MCV 91.2 90.9 91.7  MCH 29.9 29.5 29.9  MCHC 32.8 32.4 32.7  RDW 14.3 14.4 14.5  PLT 140* 125* 122*    Cardiac Enzymes Recent Labs  Lab 01/25/18 1743  TROPONINI <0.03   No results for input(s): TROPIPOC in the last 168 hours.   BNP Recent Labs  Lab 01/25/18 1743  BNP 663.0*     DDimer No results for input(s): DDIMER in the last 168 hours.   Radiology    No results found.  Cardiac Studies   ECHO:  01/28/18  Study Conclusions  - Left ventricle: The cavity size was normal. Wall thickness was   increased in a pattern of mild LVH. Systolic function was normal.   The estimated ejection fraction was in the range of 55% to 60%.   Wall motion was normal; there were no regional  wall motion   abnormalities. The study is not technically sufficient to allow   evaluation of LV diastolic function. Ejection fraction (MOD,   2-plane): 55%. - Aortic valve: Trileaflet. Sclerosis without stenosis. There was   trivial regurgitation. - Mitral valve: Mildly thickened leaflets . There was mild   regurgitation. - Left atrium: Severely dilated. - Right atrium: The atrium was mildly dilated. - Atrial septum: Thickened and calcified - ?prior closure or   amplatzer device. Negative for PFO. - Tricuspid valve: There was moderate regurgitation. - Pulmonic valve: Mildly thickened leaflets. There was mild   regurgitation. - Pulmonary arteries: PA peak pressure: 49 mm Hg (S). - Inferior vena cava: The vessel was dilated. The respirophasic   diameter changes were blunted (< 50%), consistent with elevated   central venous pressure.  Impressions:  - LVEF 55-60%, mild LVH, normal wall motion, trileaflet sclerotic   aortic valve, mild AI, mild MR, severe LAE, mild RAE, thickened   interatrial septum -  ?prior closure, negative for PFO, moderate   TR, mild PI, RVSP 49 mmHg, dilated IVC.  Patient Profile     84 y.o. female with medical history significant ofHTN and kidney stones only. Patient presents to the ED with palpitations. Intermittently having palpitations. Found to have A.Fib RVR and sent in to ED.    Assessment & Plan    ATRIAL FIB:  On Eliquis.  BP is somewhat labile but seems to be tolerating current meds with addition of Cardizem.  OK to send home on current meds.  We will arrange follow up in a couple of weeks and consider DCCV if necessary.   ACUTE DIASTOLIC HF:  Seems to be euvolemic.  No change in therapy.   MODERATE TR:  No change in therapy.  Mildly increased pulmonary pressures noted.   For questions or updates, please contact [facility] Please consult [email or web address] for contact info under Cardiology/STEMI.   Signed, Rollene Rotunda, MD  01/28/2018, 9:46 AM

## 2018-01-28 NOTE — Progress Notes (Signed)
Eliquis coupon card given to patient with explanation of usage. B Lizzette Carbonell RN,MHA,BSN [phone] 

## 2018-02-17 ENCOUNTER — Encounter (HOSPITAL_COMMUNITY): Payer: Self-pay | Admitting: Physician Assistant

## 2018-02-17 ENCOUNTER — Encounter: Payer: Self-pay | Admitting: Physician Assistant

## 2018-02-17 ENCOUNTER — Ambulatory Visit (INDEPENDENT_AMBULATORY_CARE_PROVIDER_SITE_OTHER): Payer: Medicare Other | Admitting: Physician Assistant

## 2018-02-17 VITALS — BP 118/86 | HR 101 | Ht 63.0 in

## 2018-02-17 DIAGNOSIS — I10 Essential (primary) hypertension: Secondary | ICD-10-CM | POA: Diagnosis not present

## 2018-02-17 DIAGNOSIS — Z7901 Long term (current) use of anticoagulants: Secondary | ICD-10-CM | POA: Diagnosis not present

## 2018-02-17 DIAGNOSIS — I4891 Unspecified atrial fibrillation: Secondary | ICD-10-CM | POA: Diagnosis not present

## 2018-02-17 NOTE — H&P (Addendum)
Cardiology History and Physical   Date:  02/17/2018   ID:  Samantha [name], DOB [birth year], MRN [id number]  PCP:  Samantha Pen, MD  Cardiologist: Dr. Antoine Poche, 01/28/2018 in hospital Samantha Demark, PA-C    History of Present Illness: Samantha [name] is a 84 y.o. female with a history of HTN.  Admitted 3/10-3/13/2019 for atrial fibrillation, RVR.  Cardizem and Eliquis added, early follow-up to consider cardioversion.  [place] E Lipton presents for post-hospital follow up.  She is here today with her daughter, daughter-in-law and son-in-law.  Since d/c from the hospital, she has done fairly well. An episode of SOB and weakness led to admission, but nothing that bad since.  She has noticed her heart beating fast at times. Does not get light-headed or dizzy.   She has gotten nose-bleeds x 4 in 3 days. She had clots 3 times, the other one was very light. She saw her MD and was told not to blow hard or mess with her nose. She is getting it stopped with vaseline or ice.  She does not think she has lost any significant amount of blood with the nosebleeds.  She is clear that she has not missed any doses of her Eliquis or any of her other medications.  Her family confirms this.  She feels that her activity is significantly limited by the afib. Her legs get weak.  She has not fallen, but has to sit down and rest.  She is ambulating less because of this. She also has DOE.   When she gets a nosebleed, she gets anxious and that makes her weak as well.    She struggles with vision loss from macular degeneration. Because of the poor vision, she feels unsteady on her feet at times, but has not fallen.   Past Medical History:  Diagnosis Date  . Hypertension   . Kidney stones, calcium oxalate   . Persistent atrial fibrillation with rapid ventricular response (HCC) 01/2018   No past surgical history on file.  Current Outpatient Medications  Medication Sig Dispense Refill  . acetaminophen  (TYLENOL) 500 MG tablet Take 500 mg by mouth every 6 (six) hours as needed for headache (pain).    Marland Kitchen apixaban (ELIQUIS) 5 MG TABS tablet Take 1 tablet (5 mg total) by mouth 2 (two) times daily. 60 tablet 0  . carvedilol (COREG) 25 MG tablet Take 25 mg by mouth 2 (two) times daily.  0  . cholecalciferol (VITAMIN D) 1000 units tablet Take 3,000 Units by mouth daily.    . D-Mannose POWD Take 5 mLs by mouth daily. Mix in 8 oz water and drink    . diltiazem (CARDIZEM CD) 120 MG 24 hr capsule Take 1 capsule (120 mg total) by mouth daily. 30 capsule 0  . hydrocortisone 2.5 % cream Apply topically 2 (two) times daily.    . hyoscyamine (LEVSIN SL) 0.125 MG SL tablet Take 0.125 mg by mouth at bedtime.  3  . latanoprost (XALATAN) 0.005 % ophthalmic solution Place 1 drop into both eyes at bedtime.  12  . Multiple Vitamins-Minerals (ICAPS PO) Take 1 capsule by mouth 2 (two) times daily.    . Omega-3 Fatty Acids (FISH OIL) 1000 MG CAPS Take 1,000 mg by mouth daily.    . pantoprazole (PROTONIX) 40 MG tablet Take 40 mg by mouth daily.  1  . Polyethyl Glycol-Propyl Glycol (SYSTANE OP) Place 1 drop into both eyes 2 (two) times daily.  No current facility-administered medications for this visit.     Allergies:   Tape    Social History:  The patient  reports that she has never smoked. She has never used smokeless tobacco. She reports that she does not drink alcohol or use drugs.   Family Status: The patient indicated that her mother is deceased. She indicated that her father is deceased. She indicated that her daughter is alive.  Family History:  The patient's family history includes Heart disease in her father and mother; Heart failure in her daughter.    ROS:  Please see the history of present illness. All other systems are reviewed and negative.    PHYSICAL EXAM: VS:  BP 118/86 (BP Location: Right Arm, Patient Position: Sitting, Cuff Size: Normal)   Pulse (!) 101   Ht 5\' 3"  (1.6 m)   LMP  (LMP  Unknown)   SpO2 95%   BMI 27.92 kg/m  , BMI Body mass index is 27.92 kg/m. GEN: Well nourished, well developed, female in no acute distress  HEENT: normal for age  Neck: no JVD, no carotid bruit, no masses Cardiac: Rapid and Irreg R&R; no murmur, no rubs, or gallops Respiratory: Decreased breath sounds bases bilaterally, normal work of breathing GI: soft, nontender, nondistended, + BS MS: Kyphosis but no atrophy; no edema; distal pulses are 2+ in all 4 extremities   Skin: warm and dry, no rash Neuro:  Strength and sensation are intact Psych: euthymic mood, full affect   EKG:  EKG is ordered today. The ekg ordered today demonstrates atrial fib, rapid ventricular response, heart rate 101  ECHO:  01/28/18 Study Conclusions - Left ventricle: The cavity size was normal. Wall thickness was increased in a pattern of mild LVH. Systolic function was normal. The estimated ejection fraction was in the range of 55% to 60%. Wall motion was normal; there were no regional wall motion abnormalities. The study is not technically sufficient to allow evaluation of LV diastolic function. Ejection fraction (MOD, 2-plane): 55%. - Aortic valve: Trileaflet. Sclerosis without stenosis. There was trivial regurgitation. - Mitral valve: Mildly thickened leaflets . There was mild regurgitation. - Left atrium: Severely dilated. - Right atrium: The atrium was mildly dilated. - Atrial septum: Thickened and calcified - ?prior closure or amplatzer device. Negative for PFO. - Tricuspid valve: There was moderate regurgitation. - Pulmonic valve: Mildly thickened leaflets. There was mild regurgitation. - Pulmonary arteries: PA peak pressure: 49 mm Hg (S). - Inferior vena cava: The vessel was dilated. The respirophasic diameter changes were blunted (<50%), consistent with elevated central venous pressure. Impressions: - LVEF 55-60%, mild LVH, normal wall motion, trileaflet  sclerotic aortic valve, mild AI, mild MR, severe LAE, mild RAE, thickened interatrial septum - ?prior closure, negative for PFO, moderate TR, mild PI, RVSP 49 mmHg, dilated IVC.   Recent Labs: 01/25/2018: B Natriuretic Peptide 663.0 01/26/2018: Magnesium 1.8; TSH 1.818 01/27/2018: BUN 16; Creatinine, Ser 0.80; Hemoglobin 11.2; Platelets 122; Potassium 4.0; Sodium 141    Lipid Panel No results found for: CHOL, TRIG, HDL, CHOLHDL, VLDL, LDLCALC, LDLDIRECT   Wt Readings from Last 3 Encounters:  01/28/18 157 lb 9.6 oz (71.5 kg)     Other studies Reviewed: Additional studies/ records that were reviewed today include: Office notes, hospital records and testing.  ASSESSMENT AND PLAN:  1.  Persistent atrial fibrillation, rapid ventricular response:  -She is symptomatic from the atrial fibrillation with weakness and dyspnea on exertion.  - Her rate is not well controlled.   -  She is on a high dose of carvedilol plus Cardizem CD 120 mg qd.  I do not feel that her blood pressure would tolerate an increase in the Cardizem from 120 up to 180 mg. - The risks and benefits of a cardioversion were discussed with the patient who indicates understanding and agrees to proceed.  -She wishes to wait until she does not need a TEE. -She was on Eliquis when she left the hospital on 01/28/2018.  She understands not to miss any doses. - We will schedule the cardioversion for 03/02/2018 or after. -She has severe left atrial dilatation and mild right atrial dilatation -She understands that this might not work long-term, but I think she is symptomatic from the atrial fibrillation and therefore we should try to maintain sinus rhythm    Current medicines are reviewed at length with the patient today.  The patient does not have concerns regarding medicines.  The following changes have been made:  no change  Labs/ tests ordered today include:   Orders Placed This Encounter  Procedures  . Basic metabolic  panel  . CBC  . EKG 12-Lead     Disposition:   FU with Dr. Antoine Poche  Signed, Samantha Demark, PA-C  02/17/2018 2:03 PM    [facility] Phone: [phone]; Fax: [phone]  This note was written with the assistance of speech recognition software. Please excuse any transcriptional errors.

## 2018-02-17 NOTE — Patient Instructions (Addendum)
You are scheduled for a Cardioversion on Monday, April 15th, 2019 with Dr. Eden EmmsNishan.  Please [facility] at [facility]: [address] [place], [place] [address] at 9:00 am.  DIET: Nothing to eat or drink after midnight except a sip of water with medications (see medication instructions below)  Medication Instructions: Continue your anticoagulant: ELIQUIS - VERY IMPORTANT TO NOT MISS ANY DOSES You will need to continue your anticoagulant after your procedure until you are told by your provider that it is safe to stop.   Labs: You will need to have blood work completed no more than 7 days prior to your procedure. You may take the lab slips to any lab you choose to have it completed however, we do have a lab located within our office if we are convenient for you. You do not need an appointment to have labs completed here.   You must have a responsible person to drive you home and stay in the waiting area during your procedure. Failure to do so could result in cancellation.  Bring your insurance cards.  *Special Note: Every effort is made to have your procedure done on time. Occasionally there are emergencies that occur at the hospital that may cause delays. Please be patient if a delay does occur.

## 2018-02-17 NOTE — Progress Notes (Signed)
Cardiology Office Note   Date:  02/17/2018   ID:  Samantha [name], DOB Apr 05, 1927, MRN [id number]  PCP:  Hadley Pen, MD  Cardiologist: Dr. Antoine Poche, 01/28/2018 in hospital Theodore Demark, PA-C    History of Present Illness: Samantha [name] is a 84 y.o. female with a history of HTN.  Admitted 3/10-3/13/2019 for atrial fibrillation, RVR.  Cardizem and Eliquis added, early follow-up to consider cardioversion.  Samantha [name] presents for post-hospital follow up.  She is here today with her daughter, daughter-in-law and son-in-law.  Since d/c from the hospital, she has done fairly well. An episode of SOB and weakness led to admission, but nothing that bad since.  She has noticed her heart beating fast at times. Does not get light-headed or dizzy.   She has gotten nose-bleeds x 4 in 3 days. She had clots 3 times, the other one was very light. She saw her MD and was told not to blow hard or mess with her nose. She is getting it stopped with vaseline or ice.  She does not think she has lost any significant amount of blood with the nosebleeds.  She is clear that she has not missed any doses of her Eliquis or any of her other medications.  Her family confirms this.  She feels that her activity is significantly limited by the afib. Her legs get weak.  She has not fallen, but has to sit down and rest.  She is ambulating less because of this. She also has DOE.   When she gets a nosebleed, she gets anxious and that makes her weak as well.    She struggles with vision loss from macular degeneration. Because of the poor vision, she feels unsteady on her feet at times, but has not fallen.    Past Medical History:  Diagnosis Date  . Hypertension   . Kidney stones, calcium oxalate     No past surgical history on file.  Current Outpatient Medications  Medication Sig Dispense Refill  . acetaminophen (TYLENOL) 500 MG tablet Take 500 mg by mouth every 6 (six) hours as needed for headache  (pain).    Marland Kitchen apixaban (ELIQUIS) 5 MG TABS tablet Take 1 tablet (5 mg total) by mouth 2 (two) times daily. 60 tablet 0  . carvedilol (COREG) 25 MG tablet Take 25 mg by mouth 2 (two) times daily.  0  . cholecalciferol (VITAMIN D) 1000 units tablet Take 3,000 Units by mouth daily.    . D-Mannose POWD Take 5 mLs by mouth daily. Mix in 8 oz water and drink    . diltiazem (CARDIZEM CD) 120 MG 24 hr capsule Take 1 capsule (120 mg total) by mouth daily. 30 capsule 0  . hydrocortisone 2.5 % cream Apply topically 2 (two) times daily.    . hyoscyamine (LEVSIN SL) 0.125 MG SL tablet Take 0.125 mg by mouth at bedtime.  3  . latanoprost (XALATAN) 0.005 % ophthalmic solution Place 1 drop into both eyes at bedtime.  12  . Multiple Vitamins-Minerals (ICAPS PO) Take 1 capsule by mouth 2 (two) times daily.    . Omega-3 Fatty Acids (FISH OIL) 1000 MG CAPS Take 1,000 mg by mouth daily.    . pantoprazole (PROTONIX) 40 MG tablet Take 40 mg by mouth daily.  1  . Polyethyl Glycol-Propyl Glycol (SYSTANE OP) Place 1 drop into both eyes 2 (two) times daily.     No current facility-administered medications for this visit.  Allergies:   Tape    Social History:  The patient  reports that she has never smoked. She has never used smokeless tobacco. She reports that she does not drink alcohol or use drugs.   Family Status: The patient indicated that her mother is deceased. She indicated that her father is deceased. She indicated that her daughter is alive.  Family History:  The patient's family history includes Heart disease in her father and mother; Heart failure in her daughter.    ROS:  Please see the history of present illness. All other systems are reviewed and negative.    PHYSICAL EXAM: VS:  BP 118/86 (BP Location: Right Arm, Patient Position: Sitting, Cuff Size: Normal)   Pulse (!) 101   Ht 5\' 3"  (1.6 m)   LMP  (LMP Unknown)   SpO2 95%   BMI 27.92 kg/m  , BMI Body mass index is 27.92 kg/m. GEN: Well  nourished, well developed, female in no acute distress  HEENT: normal for age  Neck: no JVD, no carotid bruit, no masses Cardiac: Rapid and Irreg R&R; no murmur, no rubs, or gallops Respiratory: Decreased breath sounds bases bilaterally, normal work of breathing GI: soft, nontender, nondistended, + BS MS: Kyphosis but no atrophy; no edema; distal pulses are 2+ in all 4 extremities   Skin: warm and dry, no rash Neuro:  Strength and sensation are intact Psych: euthymic mood, full affect   EKG:  EKG is ordered today. The ekg ordered today demonstrates atrial fib, rapid ventricular response, heart rate 101  ECHO:  01/28/18 Study Conclusions - Left ventricle: The cavity size was normal. Wall thickness was increased in a pattern of mild LVH. Systolic function was normal. The estimated ejection fraction was in the range of 55% to 60%. Wall motion was normal; there were no regional wall motion abnormalities. The study is not technically sufficient to allow evaluation of LV diastolic function. Ejection fraction (MOD, 2-plane): 55%. - Aortic valve: Trileaflet. Sclerosis without stenosis. There was trivial regurgitation. - Mitral valve: Mildly thickened leaflets . There was mild regurgitation. - Left atrium: Severely dilated. - Right atrium: The atrium was mildly dilated. - Atrial septum: Thickened and calcified - ?prior closure or amplatzer device. Negative for PFO. - Tricuspid valve: There was moderate regurgitation. - Pulmonic valve: Mildly thickened leaflets. There was mild regurgitation. - Pulmonary arteries: PA peak pressure: 49 mm Hg (S). - Inferior vena cava: The vessel was dilated. The respirophasic diameter changes were blunted (<50%), consistent with elevated central venous pressure. Impressions: - LVEF 55-60%, mild LVH, normal wall motion, trileaflet sclerotic aortic valve, mild AI, mild MR, severe LAE, mild RAE, thickened interatrial septum  - ?prior closure, negative for PFO, moderate TR, mild PI, RVSP 49 mmHg, dilated IVC.   Recent Labs: 01/25/2018: B Natriuretic Peptide 663.0 01/26/2018: Magnesium 1.8; TSH 1.818 01/27/2018: BUN 16; Creatinine, Ser 0.80; Hemoglobin 11.2; Platelets 122; Potassium 4.0; Sodium 141    Lipid Panel No results found for: CHOL, TRIG, HDL, CHOLHDL, VLDL, LDLCALC, LDLDIRECT   Wt Readings from Last 3 Encounters:  01/28/18 157 lb 9.6 oz (71.5 kg)     Other studies Reviewed: Additional studies/ records that were reviewed today include: Office notes, hospital records and testing.  ASSESSMENT AND PLAN:  1.  Persistent atrial fibrillation, rapid ventricular response:  -She is symptomatic from the atrial fibrillation with weakness and dyspnea on exertion.  - Her rate is not well controlled.   - She is on a high dose of carvedilol plus  Cardizem CD 120 mg qd.  I do not feel that her blood pressure would tolerate an increase in the Cardizem from 120 up to 180 mg. - The risks and benefits of a cardioversion were discussed with the patient who indicates understanding and agrees to proceed.  -She wishes to wait until she does not need a TEE. -She was on Eliquis when she left the hospital on 01/28/2018.  She understands not to miss any doses. - We will schedule the cardioversion for 03/02/2018 or after.  2.  Chronic anticoagulation: -She has had some nosebleeds on the Eliquis, she saw her PCP and was given instructions on how to minimize them. -Advised her it is okay to use saline nasal spray as needed to keep nasal tissues moist.   -No other bleeding issues or problems -The importance of not missing a dose of Eliquis was emphasized  3.  Hypertension: -Good control on current medications, continue Coreg 25 mg twice daily and Cardizem CD 120 mg daily  Current medicines are reviewed at length with the patient today.  The patient does not have concerns regarding medicines.  The following changes have been  made:  no change  Labs/ tests ordered today include:   Orders Placed This Encounter  Procedures  . Basic metabolic panel  . CBC  . EKG 12-Lead     Disposition:   FU with Dr. Antoine PocheHochrein  Signed, Theodore Demarkhonda Claborn Janusz, PA-C  02/17/2018 2:03 PM    [facility] Phone: [phone]; Fax: [phone]  This note was written with the assistance of speech recognition software. Please excuse any transcriptional errors.

## 2018-02-19 ENCOUNTER — Other Ambulatory Visit: Payer: Self-pay | Admitting: Cardiology

## 2018-02-19 ENCOUNTER — Telehealth: Payer: Self-pay | Admitting: *Deleted

## 2018-02-19 NOTE — Telephone Encounter (Signed)
Medication samples have been provided to the patient.  Drug name: eliquis 5mg   Qty: 3 boxes  LOT: [id number]  Exp.Date: 04/2020  Samples left at front desk for patient pick-up. Patient notified.  Julaine Fusilkins, Mckinzey Entwistle M 4:37 PM 02/19/2018

## 2018-02-19 NOTE — Telephone Encounter (Signed)
Patient calling the office for samples of medication:   1.  What medication and dosage are you requesting samples for? Eliquis  2.  Are you currently out of this medication?  Pt need these samples until she get help with her Eliquis

## 2018-02-19 NOTE — Telephone Encounter (Signed)
Patients daughter, Bonita QuinLinda called and stated that the eliquis refill was going to be very expensive. She asked if there were any coupons or assistance for this medication.  I provided her with the number to contact eliquis 360 support to see if she qualifies. I also explained the patient assistance process with her. She is going to call eliquis 360 but she is requesting samples in the mean time. Please call Bonita QuinLinda at [phone]. Thanks, MI

## 2018-02-20 NOTE — Telephone Encounter (Signed)
Samples of Elquis and pt assistance form place at front desk for pt to pick up

## 2018-02-24 ENCOUNTER — Other Ambulatory Visit: Payer: Self-pay | Admitting: *Deleted

## 2018-02-24 MED ORDER — APIXABAN 5 MG PO TABS: 5.0000 mg | ORAL_TABLET | Freq: Two times a day (BID) | ORAL | 3 refills | Status: DC

## 2018-02-26 LAB — BASIC METABOLIC PANEL
BUN/Creatinine Ratio: 14 (ref 12–28)
BUN: 12 mg/dL (ref 8–27)
CO2: 31 mmol/L — ABNORMAL HIGH (ref 20–29)
CREATININE: 0.84 mg/dL (ref 0.57–1.00)
Calcium: 10.7 mg/dL — ABNORMAL HIGH (ref 8.7–10.3)
Chloride: 103 mmol/L (ref 96–106)
GFR, EST AFRICAN AMERICAN: 72 mL/min/{1.73_m2} (ref >59)
GFR, EST NON AFRICAN AMERICAN: 62 mL/min/{1.73_m2} (ref >59)
Glucose: 105 mg/dL — ABNORMAL HIGH (ref 65–99)
POTASSIUM: 3.6 mmol/L (ref 3.5–5.2)
Sodium: 147 mmol/L — ABNORMAL HIGH (ref 134–144)

## 2018-02-26 LAB — CBC
HEMOGLOBIN: 12.9 g/dL (ref 11.1–15.9)
Hematocrit: 40.8 % (ref 34.0–46.6)
MCH: 29.3 pg (ref 26.6–33.0)
MCHC: 31.6 g/dL (ref 31.5–35.7)
MCV: 93 fL (ref 79–97)
Platelets: 177 10*3/uL (ref 150–379)
RBC: 4.4 x10E6/uL (ref 3.77–5.28)
RDW: 14.4 % (ref 12.3–15.4)
WBC: 8.9 10*3/uL (ref 3.4–10.8)

## 2018-03-02 ENCOUNTER — Encounter (HOSPITAL_COMMUNITY)
Admission: RE | Disposition: A | Discharge status: Home / Self Care | Payer: Self-pay | Source: Ambulatory Visit | Attending: Cardiovascular Disease

## 2018-03-02 ENCOUNTER — Ambulatory Visit (HOSPITAL_COMMUNITY): Payer: Medicare Other | Admitting: Anesthesiology

## 2018-03-02 ENCOUNTER — Encounter (HOSPITAL_COMMUNITY): Payer: Self-pay | Admitting: *Deleted

## 2018-03-02 ENCOUNTER — Ambulatory Visit (HOSPITAL_BASED_OUTPATIENT_CLINIC_OR_DEPARTMENT_OTHER)
Admission: RE | Admit: 2018-03-02 | Discharge: 2018-03-02 | Disposition: A | Discharge status: Home / Self Care | Payer: Medicare Other | Source: Ambulatory Visit | Attending: Cardiovascular Disease | Admitting: Cardiovascular Disease

## 2018-03-02 DIAGNOSIS — E876 Hypokalemia: Secondary | ICD-10-CM | POA: Diagnosis present

## 2018-03-02 DIAGNOSIS — I4819 Other persistent atrial fibrillation: Secondary | ICD-10-CM

## 2018-03-02 DIAGNOSIS — I480 Paroxysmal atrial fibrillation: Secondary | ICD-10-CM | POA: Diagnosis present

## 2018-03-02 DIAGNOSIS — Z7902 Long term (current) use of antithrombotics/antiplatelets: Secondary | ICD-10-CM

## 2018-03-02 DIAGNOSIS — Z79899 Other long term (current) drug therapy: Secondary | ICD-10-CM

## 2018-03-02 DIAGNOSIS — R739 Hyperglycemia, unspecified: Secondary | ICD-10-CM | POA: Diagnosis present

## 2018-03-02 DIAGNOSIS — I10 Essential (primary) hypertension: Secondary | ICD-10-CM

## 2018-03-02 DIAGNOSIS — I110 Hypertensive heart disease with heart failure: Principal | ICD-10-CM | POA: Diagnosis present

## 2018-03-02 DIAGNOSIS — Z7901 Long term (current) use of anticoagulants: Secondary | ICD-10-CM

## 2018-03-02 DIAGNOSIS — Z66 Do not resuscitate: Secondary | ICD-10-CM | POA: Diagnosis present

## 2018-03-02 DIAGNOSIS — J9600 Acute respiratory failure, unspecified whether with hypoxia or hypercapnia: Secondary | ICD-10-CM | POA: Diagnosis present

## 2018-03-02 DIAGNOSIS — Z8249 Family history of ischemic heart disease and other diseases of the circulatory system: Secondary | ICD-10-CM | POA: Insufficient documentation

## 2018-03-02 DIAGNOSIS — I481 Persistent atrial fibrillation: Secondary | ICD-10-CM | POA: Diagnosis present

## 2018-03-02 DIAGNOSIS — I5031 Acute diastolic (congestive) heart failure: Secondary | ICD-10-CM | POA: Diagnosis present

## 2018-03-02 DIAGNOSIS — J810 Acute pulmonary edema: Secondary | ICD-10-CM | POA: Diagnosis not present

## 2018-03-02 HISTORY — DX: Other persistent atrial fibrillation: I48.19

## 2018-03-02 HISTORY — PX: CARDIOVERSION: SHX1299

## 2018-03-02 SURGERY — CARDIOVERSION
Anesthesia: General

## 2018-03-02 MED ORDER — SODIUM CHLORIDE 0.9 % IV SOLN
INTRAVENOUS | Status: DC
  Administered 2018-03-02 (×2): via INTRAVENOUS

## 2018-03-02 MED ORDER — LIDOCAINE HCL (CARDIAC) 20 MG/ML IV SOLN
INTRAVENOUS | Status: DC | PRN
  Administered 2018-03-02: 100 mg via INTRATRACHEAL

## 2018-03-02 MED ORDER — PROPOFOL 10 MG/ML IV BOLUS
INTRAVENOUS | Status: DC | PRN
  Administered 2018-03-02: 30 mg via INTRAVENOUS

## 2018-03-02 NOTE — Transfer of Care (Signed)
Immediate Anesthesia Transfer of Care Note  Patient: Samantha [name]  Procedure(s) Performed: CARDIOVERSION (N/A )  Patient Location: Endoscopy Unit  Anesthesia Type:General  Level of Consciousness: awake, alert  and oriented  Airway & Oxygen Therapy: Patient Spontanous Breathing and Patient connected to nasal cannula oxygen  Post-op Assessment: Report given to RN and Post -op Vital signs reviewed and stable  Post vital signs: Reviewed and stable  Last Vitals:  Vitals Value Taken Time  BP    Temp    Pulse    Resp    SpO2      Last Pain:  Vitals:   03/02/18 0906  TempSrc: Oral  PainSc: 0-No pain         Complications: No apparent anesthesia complications

## 2018-03-02 NOTE — Anesthesia Postprocedure Evaluation (Signed)
Anesthesia Post Note  Patient: Samantha [name]  Procedure(s) Performed: CARDIOVERSION (N/A )     Patient location during evaluation: PACU Anesthesia Type: General Level of consciousness: awake and alert Pain management: pain level controlled Vital Signs Assessment: post-procedure vital signs reviewed and stable Respiratory status: spontaneous breathing, nonlabored ventilation, respiratory function stable and patient connected to nasal cannula oxygen Cardiovascular status: blood pressure returned to baseline and stable Postop Assessment: no apparent nausea or vomiting Anesthetic complications: no    Last Vitals:  Vitals:   03/02/18 1010 03/02/18 1020  BP: (!) 145/70 (!) 160/83  Pulse: 64 64  Resp: 20 20  Temp:    SpO2: 98% 95%    Last Pain:  Vitals:   03/02/18 1010  TempSrc:   PainSc: 0-No pain                 Ketsia Linebaugh EDWARD

## 2018-03-02 NOTE — Anesthesia Preprocedure Evaluation (Signed)
Anesthesia Evaluation    Airway Mallampati: II  TM Distance: >3 FB Neck ROM: Full    Dental  (+) Upper Dentures, Dental Advisory Given   Pulmonary    breath sounds clear to auscultation       Cardiovascular hypertension, Pt. on medications and Pt. on home beta blockers + dysrhythmias Atrial Fibrillation  Rhythm:Irregular Rate:Tachycardia     Neuro/Psych    GI/Hepatic   Endo/Other    Renal/GU Renal disease     Musculoskeletal   Abdominal   Peds  Hematology   Anesthesia Other Findings   Reproductive/Obstetrics                             Anesthesia Physical Anesthesia Plan  ASA: III  Anesthesia Plan: General   Post-op Pain Management:    Induction: Intravenous  PONV Risk Score and Plan: 3 and Treatment may vary due to age or medical condition  Airway Management Planned: Mask  Additional Equipment:   Intra-op Plan:   Post-operative Plan:   Informed Consent: I have reviewed the patients History and Physical, chart, labs and discussed the procedure including the risks, benefits and alternatives for the proposed anesthesia with the patient or authorized representative who has indicated his/her understanding and acceptance.   Dental advisory given  Plan Discussed with: CRNA  Anesthesia Plan Comments:         Anesthesia Quick Evaluation

## 2018-03-02 NOTE — Discharge Instructions (Signed)
Electrical Cardioversion, Care After °This sheet gives you information about how to care for yourself after your procedure. Your health care provider may also give you more specific instructions. If you have problems or questions, contact your health care provider. °What can I expect after the procedure? °After the procedure, it is common to have: °· Some redness on the skin where the shocks were given. ° °Follow these instructions at home: °· Do not drive for 24 hours if you were given a medicine to help you relax (sedative). °· Take over-the-counter and prescription medicines only as told by your health care provider. °· Ask your health care provider how to check your pulse. Check it often. °· Rest for 48 hours after the procedure or as told by your health care provider. °· Avoid or limit your caffeine use as told by your health care provider. °Contact a health care provider if: °· You feel like your heart is beating too quickly or your pulse is not regular. °· You have a serious muscle cramp that does not go away. °Get help right away if: °· You have discomfort in your chest. °· You are dizzy or you feel faint. °· You have trouble breathing or you are short of breath. °· Your speech is slurred. °· You have trouble moving an arm or leg on one side of your body. °· Your fingers or toes turn cold or blue. °This information is not intended to replace advice given to you by your health care provider. Make sure you discuss any questions you have with your health care provider. °Document Released: 08/25/2013 Document Revised: 06/07/2016 Document Reviewed: 05/10/2016 °Elsevier Interactive Patient Education © 2018 [employer] ° °

## 2018-03-02 NOTE — CV Procedure (Signed)
DCC: On Rx Eliquis with no missed doses Anesthesia Dr Jean RosenthalJackson Propofol  DCC x 1 biphasic 150 J's Converted to NSR rate 62  No immediate neurologic sequleae  Charlton HawsPeter Eartha Vonbehren

## 2018-03-03 ENCOUNTER — Encounter (HOSPITAL_COMMUNITY): Payer: Self-pay | Admitting: Emergency Medicine

## 2018-03-03 ENCOUNTER — Inpatient Hospital Stay (HOSPITAL_COMMUNITY)
Admission: EM | Admit: 2018-03-03 | Discharge: 2018-03-06 | DRG: 291 | Disposition: A | Discharge status: Skilled Nursing Facility | Payer: Medicare Other | Attending: Internal Medicine | Admitting: Internal Medicine

## 2018-03-03 ENCOUNTER — Emergency Department (HOSPITAL_COMMUNITY): Payer: Medicare Other

## 2018-03-03 ENCOUNTER — Other Ambulatory Visit: Payer: Self-pay

## 2018-03-03 ENCOUNTER — Inpatient Hospital Stay (HOSPITAL_COMMUNITY): Payer: Medicare Other

## 2018-03-03 DIAGNOSIS — I110 Hypertensive heart disease with heart failure: Secondary | ICD-10-CM | POA: Diagnosis present

## 2018-03-03 DIAGNOSIS — R739 Hyperglycemia, unspecified: Secondary | ICD-10-CM | POA: Diagnosis not present

## 2018-03-03 DIAGNOSIS — J810 Acute pulmonary edema: Secondary | ICD-10-CM | POA: Diagnosis not present

## 2018-03-03 DIAGNOSIS — Z7901 Long term (current) use of anticoagulants: Secondary | ICD-10-CM | POA: Diagnosis not present

## 2018-03-03 DIAGNOSIS — R0600 Dyspnea, unspecified: Secondary | ICD-10-CM | POA: Diagnosis not present

## 2018-03-03 DIAGNOSIS — I4891 Unspecified atrial fibrillation: Secondary | ICD-10-CM | POA: Diagnosis present

## 2018-03-03 DIAGNOSIS — I10 Essential (primary) hypertension: Secondary | ICD-10-CM | POA: Diagnosis not present

## 2018-03-03 DIAGNOSIS — I5021 Acute systolic (congestive) heart failure: Secondary | ICD-10-CM

## 2018-03-03 DIAGNOSIS — I509 Heart failure, unspecified: Secondary | ICD-10-CM | POA: Diagnosis not present

## 2018-03-03 DIAGNOSIS — E876 Hypokalemia: Secondary | ICD-10-CM

## 2018-03-03 DIAGNOSIS — J9601 Acute respiratory failure with hypoxia: Secondary | ICD-10-CM

## 2018-03-03 DIAGNOSIS — I5031 Acute diastolic (congestive) heart failure: Secondary | ICD-10-CM

## 2018-03-03 DIAGNOSIS — Z66 Do not resuscitate: Secondary | ICD-10-CM | POA: Diagnosis present

## 2018-03-03 DIAGNOSIS — I480 Paroxysmal atrial fibrillation: Secondary | ICD-10-CM | POA: Diagnosis not present

## 2018-03-03 DIAGNOSIS — J9600 Acute respiratory failure, unspecified whether with hypoxia or hypercapnia: Secondary | ICD-10-CM | POA: Diagnosis present

## 2018-03-03 DIAGNOSIS — Z8249 Family history of ischemic heart disease and other diseases of the circulatory system: Secondary | ICD-10-CM | POA: Diagnosis not present

## 2018-03-03 DIAGNOSIS — Z79899 Other long term (current) drug therapy: Secondary | ICD-10-CM | POA: Diagnosis not present

## 2018-03-03 DIAGNOSIS — I481 Persistent atrial fibrillation: Secondary | ICD-10-CM | POA: Diagnosis present

## 2018-03-03 HISTORY — DX: Hyperglycemia, unspecified: R73.9

## 2018-03-03 HISTORY — DX: Heart failure, unspecified: I50.9

## 2018-03-03 LAB — BASIC METABOLIC PANEL
ANION GAP: 9 (ref 5–15)
BUN: 12 mg/dL (ref 6–20)
CHLORIDE: 106 mmol/L (ref 101–111)
CO2: 23 mmol/L (ref 22–32)
CREATININE: 0.76 mg/dL (ref 0.44–1.00)
Calcium: 8.7 mg/dL — ABNORMAL LOW (ref 8.9–10.3)
GFR calc non Af Amer: >60 mL/min (ref >60)
GLUCOSE: 141 mg/dL — AB (ref 65–99)
Potassium: 4.7 mmol/L (ref 3.5–5.1)
Sodium: 138 mmol/L (ref 135–145)

## 2018-03-03 LAB — CBC
HCT: 37.7 % (ref 36.0–46.0)
HEMOGLOBIN: 12.5 g/dL (ref 12.0–15.0)
MCH: 30.3 pg (ref 26.0–34.0)
MCHC: 33.2 g/dL (ref 30.0–36.0)
MCV: 91.3 fL (ref 78.0–100.0)
Platelets: 157 10*3/uL (ref 150–400)
RBC: 4.13 MIL/uL (ref 3.87–5.11)
RDW: 14.8 % (ref 11.5–15.5)
WBC: 10 10*3/uL (ref 4.0–10.5)

## 2018-03-03 LAB — I-STAT TROPONIN, ED: Troponin i, poc: 0.01 ng/mL (ref 0.00–0.08)

## 2018-03-03 LAB — BRAIN NATRIURETIC PEPTIDE: B Natriuretic Peptide: 158.2 pg/mL — ABNORMAL HIGH (ref 0.0–100.0)

## 2018-03-03 MED ORDER — SODIUM CHLORIDE 0.9 % IV SOLN: 250.0000 mL | INTRAVENOUS | Status: DC | PRN

## 2018-03-03 MED ORDER — HYOSCYAMINE SULFATE 0.125 MG SL SUBL
0.1250 mg | SUBLINGUAL_TABLET | Freq: Every evening | SUBLINGUAL | Status: DC
  Administered 2018-03-03 – 2018-03-05 (×3): 0.125 mg via ORAL
  Filled 2018-03-03 (×4): qty 1

## 2018-03-03 MED ORDER — FUROSEMIDE 10 MG/ML IJ SOLN
40.0000 mg | Freq: Two times a day (BID) | INTRAMUSCULAR | Status: DC
  Administered 2018-03-04: 40 mg via INTRAVENOUS
  Filled 2018-03-03 (×3): qty 4

## 2018-03-03 MED ORDER — ONDANSETRON HCL 4 MG/2ML IJ SOLN
4.0000 mg | Freq: Four times a day (QID) | INTRAMUSCULAR | Status: DC | PRN
  Administered 2018-03-04: 4 mg via INTRAVENOUS
  Filled 2018-03-03: qty 2

## 2018-03-03 MED ORDER — DILTIAZEM HCL ER COATED BEADS 120 MG PO CP24
120.0000 mg | ORAL_CAPSULE | Freq: Every day | ORAL | Status: DC
  Administered 2018-03-03 – 2018-03-06 (×4): 120 mg via ORAL
  Filled 2018-03-03 (×5): qty 1

## 2018-03-03 MED ORDER — APIXABAN 5 MG PO TABS
5.0000 mg | ORAL_TABLET | Freq: Two times a day (BID) | ORAL | Status: DC
  Administered 2018-03-03 – 2018-03-06 (×7): 5 mg via ORAL
  Filled 2018-03-03 (×8): qty 1

## 2018-03-03 MED ORDER — CLONIDINE HCL 0.1 MG PO TABS: 0.1000 mg | ORAL_TABLET | Freq: Once | ORAL | Status: DC

## 2018-03-03 MED ORDER — SODIUM CHLORIDE 0.9% FLUSH
3.0000 mL | Freq: Two times a day (BID) | INTRAVENOUS | Status: DC
  Administered 2018-03-03 – 2018-03-06 (×6): 3 mL via INTRAVENOUS

## 2018-03-03 MED ORDER — SODIUM CHLORIDE 0.9% FLUSH: 3.0000 mL | INTRAVENOUS | Status: DC | PRN

## 2018-03-03 MED ORDER — FUROSEMIDE 10 MG/ML IJ SOLN
40.0000 mg | Freq: Once | INTRAMUSCULAR | Status: AC
Start: 2018-03-03 — End: 2018-03-03
  Administered 2018-03-03: 40 mg via INTRAVENOUS
  Filled 2018-03-03: qty 4

## 2018-03-03 MED ORDER — NITROGLYCERIN 2 % TD OINT
0.5000 [in_us] | TOPICAL_OINTMENT | Freq: Once | TRANSDERMAL | Status: AC
  Administered 2018-03-03: 0.5 [in_us] via TOPICAL
  Filled 2018-03-03: qty 1

## 2018-03-03 MED ORDER — LATANOPROST 0.005 % OP SOLN
1.0000 [drp] | Freq: Every day | OPHTHALMIC | Status: DC
  Administered 2018-03-03 – 2018-03-05 (×3): 1 [drp] via OPHTHALMIC
  Filled 2018-03-03 (×2): qty 2.5

## 2018-03-03 MED ORDER — PANTOPRAZOLE SODIUM 40 MG PO TBEC
40.0000 mg | DELAYED_RELEASE_TABLET | Freq: Every day | ORAL | Status: DC
  Administered 2018-03-03 – 2018-03-06 (×4): 40 mg via ORAL
  Filled 2018-03-03 (×5): qty 1

## 2018-03-03 MED ORDER — CARVEDILOL 25 MG PO TABS
25.0000 mg | ORAL_TABLET | Freq: Two times a day (BID) | ORAL | Status: DC
  Administered 2018-03-03 – 2018-03-06 (×6): 25 mg via ORAL
  Filled 2018-03-03: qty 2
  Filled 2018-03-03 (×6): qty 1

## 2018-03-03 MED ORDER — ACETAMINOPHEN 325 MG PO TABS
650.0000 mg | ORAL_TABLET | ORAL | Status: DC | PRN
  Administered 2018-03-03 – 2018-03-05 (×4): 650 mg via ORAL
  Filled 2018-03-03 (×4): qty 2

## 2018-03-03 NOTE — ED Notes (Signed)
Heart Healthy Diet has been ordered for Dinner.. 

## 2018-03-03 NOTE — ED Notes (Signed)
Pt appears more shob at this time. Pt moved to room RT called for bipap

## 2018-03-03 NOTE — ED Provider Notes (Signed)
[facility] EMERGENCY DEPARTMENT Provider Note   CSN: [id number] Arrival date & time: 03/03/18  0255     History   Chief Complaint Chief Complaint  Patient presents with  . Shortness of Breath    HPI Samantha [name] is a 84 y.o. female.  The history is provided by the patient.  Shortness of Breath  This is a recurrent problem. The average episode lasts 3 hours. The problem occurs continuously.The current episode started 1 to 2 hours ago. Associated symptoms include PND. Pertinent negatives include no fever, no swollen glands, no chest pain, no syncope, no vomiting and no leg swelling. It is unknown what precipitated the problem. She has tried nothing for the symptoms. The treatment provided no relief. She has had prior hospitalizations. She has had prior ED visits. She has had no prior ICU admissions. Associated medical issues include heart failure.    Past Medical History:  Diagnosis Date  . Hypertension   . Kidney stones, calcium oxalate   . Persistent atrial fibrillation with rapid ventricular response (HCC) 01/2018    Patient Active Problem List   Diagnosis Date Noted  . Persistent atrial fibrillation with rapid ventricular response (HCC)   . Atrial fibrillation with RVR (HCC) 01/25/2018  . Acute diastolic CHF (congestive heart failure) (HCC) 01/25/2018  . HTN (hypertension) 01/25/2018    Past Surgical History:  Procedure Laterality Date  . CARDIOVERSION N/A 03/02/2018   Procedure: CARDIOVERSION;  Surgeon: Wendall Stade, MD;  Location: [facility] ENDOSCOPY;  Service: Cardiovascular;  Laterality: N/A;     OB History   None      Home Medications    Prior to Admission medications   Medication Sig Start Date End Date Taking? Authorizing Provider  acetaminophen (TYLENOL) 500 MG tablet Take 500 mg by mouth every 6 (six) hours as needed for moderate pain or headache.     [provider]  apixaban (ELIQUIS) 5 MG TABS tablet Take 1 tablet (5 mg  total) by mouth 2 (two) times daily. 02/24/18   Rollene Rotunda, MD  carvedilol (COREG) 25 MG tablet Take 25 mg by mouth 2 (two) times daily. 11/04/17   [provider]  cholecalciferol (VITAMIN D) 1000 units tablet Take 1,000 Units by mouth daily.     [provider]  D-Mannose POWD Take 5 mLs by mouth daily. Mix in 8 oz water and drink    [provider]  diltiazem (CARDIZEM CD) 120 MG 24 hr capsule Take 1 capsule (120 mg total) by mouth daily. 01/29/18   Joseph Art, DO  hydrocortisone 2.5 % cream Apply 1 application topically 2 (two) times daily.     [provider]  hyoscyamine (LEVSIN SL) 0.125 MG SL tablet Take 0.125 mg by mouth every evening.  12/08/17   [provider]  latanoprost (XALATAN) 0.005 % ophthalmic solution Place 1 drop into both eyes at bedtime. 01/05/18   [provider]  Multiple Vitamins-Minerals (ICAPS PO) Take 1 capsule by mouth 2 (two) times daily.    [provider]  Omega-3 Fatty Acids (FISH OIL) 1000 MG CAPS Take 1,000 mg by mouth daily.    [provider]  pantoprazole (PROTONIX) 40 MG tablet Take 40 mg by mouth daily. 11/04/17   [provider]  Polyethyl Glycol-Propyl Glycol (SYSTANE OP) Place 1 drop into both eyes 2 (two) times daily.    [provider]  potassium chloride (K-DUR) 10 MEQ tablet Take 10 mEq by mouth daily. 02/23/18  03/02/18  [provider]    Family History Family History  Problem Relation Age of Onset  . Heart disease Mother   . Heart disease Father   . Heart failure Daughter     Social History Social History   Tobacco Use  . Smoking status: Never Smoker  . Smokeless tobacco: Never Used  Substance Use Topics  . Alcohol use: No    Frequency: Never  . Drug use: No     Allergies   Tape   Review of Systems Review of Systems  Constitutional: Negative for fever.  HENT: Negative for trouble swallowing and voice change.   Respiratory:  Positive for shortness of breath.   Cardiovascular: Positive for PND. Negative for chest pain, leg swelling and syncope.  Gastrointestinal: Negative for vomiting.  All other systems reviewed and are negative.    Physical Exam Updated Vital Signs BP (!) 165/108 (BP Location: Left Arm)   Pulse 86   Temp 98.2 F (36.8 C) (Oral)   Resp (!) 32   Ht 5\' 3"  (1.6 m)   Wt 72.6 kg (160 lb)   SpO2 96%   BMI 28.34 kg/m   Physical Exam  Constitutional: She is oriented to person, place, and time. She appears well-developed and well-nourished. She appears distressed.  HENT:  Head: Normocephalic and atraumatic.  Nose: Nose normal.  Eyes: Conjunctivae and EOM are normal.  Neck: Normal range of motion. Neck supple.  Cardiovascular: Normal rate, regular rhythm, normal heart sounds and intact distal pulses.  Pulmonary/Chest: No stridor. She is in respiratory distress. She has rales.  Abdominal: Soft. Bowel sounds are normal. She exhibits no mass. There is no tenderness. There is no rebound and no guarding.  Musculoskeletal: Normal range of motion.  Neurological: She is alert and oriented to person, place, and time. She displays normal reflexes.  Skin: Skin is warm and dry. Capillary refill takes less than 2 seconds.  Psychiatric: She has a normal mood and affect.     ED Treatments / Results  Labs (all labs ordered are listed, but only abnormal results are displayed) Results for orders placed or performed during the hospital encounter of 03/03/18  Basic metabolic panel  Result Value Ref Range   Sodium 138 135 - 145 mmol/L   Potassium 4.7 3.5 - 5.1 mmol/L   Chloride 106 101 - 111 mmol/L   CO2 23 22 - 32 mmol/L   Glucose, Bld 141 (H) 65 - 99 mg/dL   BUN 12 6 - 20 mg/dL   Creatinine, Ser 1.61 0.44 - 1.00 mg/dL   Calcium 8.7 (L) 8.9 - 10.3 mg/dL   GFR calc non Af Amer >60 >60 mL/min   GFR calc Af Amer >60 >60 mL/min   Anion gap 9 5 - 15  CBC  Result Value Ref Range   WBC 10.0 4.0 - 10.5  K/uL   RBC 4.13 3.87 - 5.11 MIL/uL   Hemoglobin 12.5 12.0 - 15.0 g/dL   HCT 09.6 04.5 - 40.9 %   MCV 91.3 78.0 - 100.0 fL   MCH 30.3 26.0 - 34.0 pg   MCHC 33.2 30.0 - 36.0 g/dL   RDW 81.1 91.4 - 78.2 %   Platelets 157 150 - 400 K/uL  Brain natriuretic peptide  Result Value Ref Range   B Natriuretic Peptide 158.2 (H) 0.0 - 100.0 pg/mL  I-stat troponin, ED  Result Value Ref Range   Troponin i, poc 0.01 0.00 - 0.08 ng/mL   Comment 3  Dg Chest 2 View  Result Date: 03/03/2018 CLINICAL DATA:  Shortness of breath tonight, status post cardioversion Rever Pichette 15, 2019. History of atrial fibrillation, hypertension. EXAM: CHEST - 2 VIEW COMPARISON:  Chest radiograph January 25, 2018 FINDINGS: Cardiac silhouette is mildly enlarged. Mildly calcified aortic knob. Increasing interstitial prominence with Kerley B-lines. Trace posterior pleural effusion. No focal consolidation. No pneumothorax. Osteopenia. Accentuated thoracic kyphosis with severe degenerative change of the thoracolumbar junction. Surgical clips in the included right abdomen compatible with cholecystectomy. IMPRESSION: Cardiomegaly. Increasing interstitial edema with small pleural effusion. Electronically Signed   By: Awilda Metroourtnay  Bloomer M.D.   On: 03/03/2018 04:24    EKG EKG Interpretation  Date/Time:  Tuesday Teigen Bellin 16 2019 03:08:55 EDT Ventricular Rate:  82 PR Interval:  228 QRS Duration: 114 QT Interval:  406 QTC Calculation: 474 R Axis:   -40 Text Interpretation:  Sinus rhythm with 1st degree A-V block Left axis deviation Cannot rule out Anteroseptal infarct , age undetermined Confirmed by Quintan Saldivar ([id number]) on 03/03/2018 3:14:20 AM   Radiology Dg Chest 2 View  Result Date: 03/03/2018 CLINICAL DATA:  Shortness of breath tonight, status post cardioversion Felicha Frayne 15, 2019. History of atrial fibrillation, hypertension. EXAM: CHEST - 2 VIEW COMPARISON:  Chest radiograph January 25, 2018 FINDINGS: Cardiac silhouette is mildly  enlarged. Mildly calcified aortic knob. Increasing interstitial prominence with Kerley B-lines. Trace posterior pleural effusion. No focal consolidation. No pneumothorax. Osteopenia. Accentuated thoracic kyphosis with severe degenerative change of the thoracolumbar junction. Surgical clips in the included right abdomen compatible with cholecystectomy. IMPRESSION: Cardiomegaly. Increasing interstitial edema with small pleural effusion. Electronically Signed   By: Awilda Metroourtnay  Bloomer M.D.   On: 03/03/2018 04:24    Procedures Procedures (including critical care time)  Medications Ordered in ED Medications  furosemide (LASIX) injection 40 mg (40 mg Intravenous Given 03/03/18 0513)     MDM Acute pulmonary edema (HCC):  MDM Reviewed: previous chart, nursing note and vitals Reviewed previous: labs and ECG Interpretation: labs, ECG and x-ray (negative troponin, pulmonary edema) Total time providing critical care: 75-105 minutes (bipap initiated in the ED). This excludes time spent performing separately reportable procedures and services. Consults: admitting MD   CRITICAL CARE Performed by: Jasmine AwePALUMBO-RASCH,Linsey Hirota K Total critical care time: 75 minutes Critical care time was exclusive of separately billable procedures and treating other patients. Critical care was necessary to treat or prevent imminent or life-threatening deterioration. Critical care was time spent personally by me on the following activities: development of treatment plan with patient and/or surrogate as well as nursing, discussions with consultants, evaluation of patient's response to treatment, examination of patient, obtaining history from patient or surrogate, ordering and performing treatments and interventions, ordering and review of laboratory studies, ordering and review of radiographic studies, pulse oximetry and re-evaluation of patient's condition.  Final Clinical Impressions(s) / ED Diagnoses   Final diagnoses:  Acute  pulmonary edema (HCC)    Admit to medicine for pulmonary edema     Kaulder Zahner, MD 03/03/18 [id number]

## 2018-03-03 NOTE — ED Triage Notes (Signed)
BIB EMS from home reporting SOB since last night, pt was seen here yesterday for cardioversion. Pt noted to be in NSR HR 80's today. 90% on RA, placed on 3L [place] sats to mid 90's

## 2018-03-03 NOTE — ED Notes (Signed)
Respiratory called @ 0842-per Admit MD-called by Marylene LandAngela

## 2018-03-03 NOTE — H&P (Addendum)
History and Physical    Samantha Ageeula E Creque FAO:[id number] DOB: 09-Jul-1927 DOA: 03/03/2018  PCP: Hadley Penobbins, Robert A, MD Patient coming from: home  Chief Complaint: sob  HPI: Samantha [name] is a delightful 84 y.o. female with medical history significant for hypertension, recent hospitalization for atrial fibrillation with RVR and status post cardioversion yesterday presents to the emergency Department chief complaint shortness of breath. Initial evaluation reveals acute respiratory failure and a chest x-ray with cardiomegaly and pulmonary edema concerning for acute heart failure. [facility] are asked to admit.  Information is obtained from the daughter who is at the bedside and the patient's who is on BiPAP. Daughter reports last month patient was hospitalized for atrial fibrillation. Yesterday she came for cardioversion and she reports the procedure went well. Patient went home with the daughter had an uneventful evening and then early this morning patient awakened with sudden onset of shortness of breath. Patient states she can tell when her heart is "beating funny". She denies chest pain headache dizziness syncope or near-syncope. She just states "I couldn't breathe". She denies nausea vomiting dysuria hematuria frequency or urgency. She reports compliance with her medication that includes Cardizem and eliquis.    ED Course: in the emergency department she is afebrile hemodynamically stable but quite take With an increased oxygen demand. She was provided with 40 mg Lasix IV and Nitropaste placed on BiPAP for respiratory distress. At the time of admission she remains hemodynamically stable alert and oriented oxygen saturation level 98% on BiPAP. She has diuresed well over a liter.  Review of Systems: As per HPI otherwise all other systems reviewed and are negative.   Ambulatory Status: ambulates with a cane lives at home alone is independent with ADLs  Past Medical History:  Diagnosis Date  .  Acute CHF (HCC)   . Hyperglycemia   . Hypertension   . Kidney stones, calcium oxalate   . Persistent atrial fibrillation with rapid ventricular response (HCC) 01/2018    Past Surgical History:  Procedure Laterality Date  . CARDIOVERSION N/A 03/02/2018   Procedure: CARDIOVERSION;  Surgeon: Wendall StadeNishan, Peter C, MD;  Location: [facility] ENDOSCOPY;  Service: Cardiovascular;  Laterality: N/A;    Social History   Socioeconomic History  . Marital status: Widowed    Spouse name: Not on file  . Number of children: Not on file  . Years of education: Not on file  . Highest education level: Not on file  Occupational History  . Occupation: Retired  Engineer, productionocial Needs  . Financial resource strain: Not on file  . Food insecurity:    Worry: Not on file    Inability: Not on file  . Transportation needs:    Medical: Not on file    Non-medical: Not on file  Tobacco Use  . Smoking status: Never Smoker  . Smokeless tobacco: Never Used  Substance and Sexual Activity  . Alcohol use: No    Frequency: Never  . Drug use: No  . Sexual activity: Not Currently  Lifestyle  . Physical activity:    Days per week: Not on file    Minutes per session: Not on file  . Stress: Not on file  Relationships  . Social connections:    Talks on phone: Not on file    Gets together: Not on file    Attends religious service: Not on file    Active member of club or organization: Not on file    Attends meetings of clubs or organizations: Not on file  Relationship status: Not on file  . Intimate partner violence:    Fear of current or ex partner: Not on file    Emotionally abused: Not on file    Physically abused: Not on file    Forced sexual activity: Not on file  Other Topics Concern  . Not on file  Social History Narrative  . Not on file    Allergies  Allergen Reactions  . Tape Rash and Other (See Comments)    Please use paper tape    Family History  Problem Relation Age of Onset  . Heart disease Mother   .  Heart disease Father   . Heart failure Daughter     Prior to Admission medications   Medication Sig Start Date End Date Taking? Authorizing Provider  acetaminophen (TYLENOL) 500 MG tablet Take 500 mg by mouth every 6 (six) hours as needed for moderate pain or headache.    Yes [provider]  apixaban (ELIQUIS) 5 MG TABS tablet Take 1 tablet (5 mg total) by mouth 2 (two) times daily. 02/24/18  Yes Rollene Rotunda, MD  carvedilol (COREG) 25 MG tablet Take 25 mg by mouth 2 (two) times daily. 11/04/17  Yes [provider]  cholecalciferol (VITAMIN D) 1000 units tablet Take 1,000 Units by mouth daily.    Yes [provider]  D-Mannose POWD Take 5 mLs by mouth daily. Mix in 8 oz water and drink   Yes [provider]  diltiazem (CARDIZEM CD) 120 MG 24 hr capsule Take 1 capsule (120 mg total) by mouth daily. 01/29/18  Yes Marlin Canary U, DO  hydrocortisone 2.5 % cream Apply 1 application topically 2 (two) times daily.    Yes [provider]  hyoscyamine (LEVSIN SL) 0.125 MG SL tablet Take 0.125 mg by mouth every evening.  12/08/17  Yes [provider]  latanoprost (XALATAN) 0.005 % ophthalmic solution Place 1 drop into both eyes at bedtime. 01/05/18  Yes [provider]  Multiple Vitamins-Minerals (ICAPS PO) Take 1 capsule by mouth 2 (two) times daily.   Yes [provider]  Omega-3 Fatty Acids (FISH OIL) 1000 MG CAPS Take 1,000 mg by mouth daily.   Yes [provider]  pantoprazole (PROTONIX) 40 MG tablet Take 40 mg by mouth daily. 11/04/17  Yes [provider]  Polyethyl Glycol-Propyl Glycol (SYSTANE OP) Place 1 drop into both eyes 2 (two) times daily.   Yes [provider]    Physical Exam: Vitals:   03/03/18 0630 03/03/18 0636 03/03/18 0700 03/03/18 0745  BP: 140/87  (!) 139/91 135/82  Pulse: 73 72 73 72  Resp: (!) 28 13 (!) 24 20  Temp:      TempSrc:      SpO2: 100% 100% 98% 98%  Weight:        Height:         General:  Appears calm and comfortable n BiPAP in no acute distress Eyes:  PERRL, EOMI, normal lids, iris ENT:  grossly normal hearing, lips & tongue, Neck:  no LAD, masses or thyromegaly Cardiovascular:  RRR, heart sounds are distant no m/r/g. No LE edema.  Respiratory:  No increased work of breathing on BiPAP airflow diminished anterior fine crackles bilateral bases no wheezing Abdomen:  soft, ntnd, positive bowel sounds throughout no guarding or rebounding Skin:  no rash or induration seen on limited exam Musculoskeletal:  grossly normal tone BUE/BLE, good ROM, no bony abnormality Psychiatric:  grossly normal mood and affect, speech  fluent and appropriate, AOx3 Neurologic:  CN 2-12 grossly intact, moves all extremities in coordinated fashion, sensation intact  Labs on Admission: I have personally reviewed following labs and imaging studies  CBC: Recent Labs  Lab 02/25/18 1508 03/03/18 0330  WBC 8.9 10.0  HGB 12.9 12.5  HCT 40.8 37.7  MCV 93 91.3  PLT 177 157   Basic Metabolic Panel: Recent Labs  Lab 02/25/18 1508 03/03/18 0330  NA 147* 138  K 3.6 4.7  CL 103 106  CO2 31* 23  GLUCOSE 105* 141*  BUN 12 12  CREATININE 0.84 0.76  CALCIUM 10.7* 8.7*   GFR: Estimated Creatinine Clearance: 46.4 mL/min (by C-G formula based on SCr of 0.76 mg/dL). Liver Function Tests: No results for input(s): AST, ALT, ALKPHOS, BILITOT, PROT, ALBUMIN in the last 168 hours. No results for input(s): LIPASE, AMYLASE in the last 168 hours. No results for input(s): AMMONIA in the last 168 hours. Coagulation Profile: No results for input(s): INR, PROTIME in the last 168 hours. Cardiac Enzymes: No results for input(s): CKTOTAL, CKMB, CKMBINDEX, TROPONINI in the last 168 hours. BNP (last 3 results) No results for input(s): PROBNP in the last 8760 hours. HbA1C: No results for input(s): HGBA1C in the last 72 hours. CBG: No results for input(s): GLUCAP in the last 168  hours. Lipid Profile: No results for input(s): CHOL, HDL, LDLCALC, TRIG, CHOLHDL, LDLDIRECT in the last 72 hours. Thyroid Function Tests: No results for input(s): TSH, T4TOTAL, FREET4, T3FREE, THYROIDAB in the last 72 hours. Anemia Panel: No results for input(s): VITAMINB12, FOLATE, FERRITIN, TIBC, IRON, RETICCTPCT in the last 72 hours. Urine analysis:    Component Value Date/Time   COLORURINE YELLOW 01/26/2018 0800   APPEARANCEUR CLEAR 01/26/2018 0800   LABSPEC 1.009 01/26/2018 0800   PHURINE 7.0 01/26/2018 0800   GLUCOSEU NEGATIVE 01/26/2018 0800   HGBUR NEGATIVE 01/26/2018 0800   BILIRUBINUR NEGATIVE 01/26/2018 0800   KETONESUR 5 (A) 01/26/2018 0800   PROTEINUR NEGATIVE 01/26/2018 0800   NITRITE NEGATIVE 01/26/2018 0800   LEUKOCYTESUR SMALL (A) 01/26/2018 0800    Creatinine Clearance: Estimated Creatinine Clearance: 46.4 mL/min (by C-G formula based on SCr of 0.76 mg/dL).  Sepsis Labs: @LABRCNTIP (procalcitonin:4,lacticidven:4) )No results found for this or any previous visit (from the past 240 hour(s)).   Radiological Exams on Admission: Dg Chest 2 View  Result Date: 03/03/2018 CLINICAL DATA:  Shortness of breath tonight, status post cardioversion March 02, 2018. History of atrial fibrillation, hypertension. EXAM: CHEST - 2 VIEW COMPARISON:  Chest radiograph January 25, 2018 FINDINGS: Cardiac silhouette is mildly enlarged. Mildly calcified aortic knob. Increasing interstitial prominence with Kerley B-lines. Trace posterior pleural effusion. No focal consolidation. No pneumothorax. Osteopenia. Accentuated thoracic kyphosis with severe degenerative change of the thoracolumbar junction. Surgical clips in the included right abdomen compatible with cholecystectomy. IMPRESSION: Cardiomegaly. Increasing interstitial edema with small pleural effusion. Electronically Signed   By: Awilda Metro M.D.   On: 03/03/2018 04:24    EKG: Sinus rhythm Prolonged PR interval Nonspecific IVCD with  LAD Left ventricular hypertrophy Anterior Q waves, possibly due to LVH  Assessment/Plan Principal Problem:   Acute respiratory failure (HCC) Active Problems:   Atrial fibrillation (HCC)   HTN (hypertension)   Acute CHF (HCC)   Hyperglycemia   #1. Acute respiratory failure likely related to acute heart failure in the setting of A. Fib status post cardioversion yesterday. Patient with tachypnea and increased oxygen demand. Chest x-ray with pleural effusion and pulmonary edema. Based on BiPAP in the  emergency department provided with 40 mg of Lasix IV. At the time of admission respiratory distress and tachypnea resolved patient diuresing well -Admit to step down -Continue IV Lasix -monitor oxygen saturation level -Continue BiPAP as needed -Wean BiPAP as able -request cardiology consult given her recent procedure and new diagnosis  #2. Acute CHF. bnp 158. Initial troponin negative. Echo done last month reveals an EF of 55%, mild LVH and normal systolic function, no regional wall motion abnormalities. Medications include Coreg. -Monitor intake and output -Obtain daily weights -Continue IV Lasix 40 mg twice a day -continue Coreg for now  #3. Atrial fibrillation. Status post cardioversion yesterday. EKG with sinus rhythm Home medications include eliquis. [place] score 5.  -Continue Cardizem -Continue eliquis -Monitor  #4. Hypertension.blood pressure on the high end of normal. Home medications include Cardizem and Coreg. -continue Cardizem and Coreg -Monitor     DVT prophylaxis: eliquis  Code Status: dnr  Family Communication: daughter at bedside  Disposition Plan: home  Consults called: cardmaster  Admission status: inpatient    Gwenyth Bender MD [facility]  If 7PM-7AM, please contact night-coverage [email or web address] Password [email or web address]  03/03/2018, 8:04 AM

## 2018-03-03 NOTE — Consult Note (Addendum)
Cardiology Consult    Patient ID: Samantha [name] MRN: [id number], DOB/AGE: 82-Aug-1928   Admit date: 03/03/2018 Date of Consult: 03/03/2018  Primary Physician: Hadley Penobbins, Robert A, MD Primary Cardiologist: Dr. Antoine PocheHochrein Requesting Provider: Dr. Ophelia CharterYates Reason for Consultation: CHF  Samantha [name] is a 84 y.o. female who is being seen today for the evaluation of CHF at the request of Dr. Ophelia CharterYates.   Patient Profile    84 yo female with PMH of PAF, and HTN who presented with shortness of breath, requiring the use of Bipap.   Past Medical History   Past Medical History:  Diagnosis Date  . Acute CHF (HCC)   . Hyperglycemia   . Hypertension   . Kidney stones, calcium oxalate   . Persistent atrial fibrillation with rapid ventricular response (HCC) 01/2018    Past Surgical History:  Procedure Laterality Date  . CARDIOVERSION N/A 03/02/2018   Procedure: CARDIOVERSION;  Surgeon: Wendall StadeNishan, Peter C, MD;  [facility];  Service: Cardiovascular;  Laterality: N/A;     Allergies  Allergies  Allergen Reactions  . Tape Rash and Other (See Comments)    Please use paper tape    History of Present Illness    Samantha [name] is an 84 yo female with PMH of PAF and HTN. She was originally admitted 3/10-3/13 with new onset Afib RVR. Placed on Dilt and Eliquis. Did not undergo TEE/DCCV while inpatient as she remained stable and preferred to wait until she did not have to undergo a TEE. Seen back in the office on 02/17/18 with Samantha [name] and reports some intermittent shortness of breath and weakness at times but tolerable. Also reported some episodes of nosebleeds with Eliquis. Reported being compliant with her home medications including Eliquis and was set up for outpatient DCCV. She presented yesterday 4/15 as outpatient and underwent successful DCCV with 1 shock conversion to SR with Dr. Eden EmmsNishan.  Reports she felt well post DCCV and do well during the evening. Reports she was laying bed this morning  around 1:30am when she developed sudden onset of shortness of breath. Called her daughter down the hallway and she called EMS. On arrival she was very dyspneic and required the use of Bipap.   In the ED her labs showed stable electrolytes, Trop neg x1, BNP 158, Hgb 12.5. CXR with edema and small pleural effusion. EKG showed SR with nonspecific IVCD. She was given IV lasix 40mg  x1 with good UOP. Able to wean from the Bipap. At the time of exam she is very comfortable and sat 96% on [place].   Inpatient Medications    . apixaban  5 mg Oral BID  . carvedilol  25 mg Oral BID WC  . diltiazem  120 mg Oral Daily  . furosemide  40 mg Intravenous BID  . hyoscyamine  0.125 mg Oral QPM  . latanoprost  1 drop Both Eyes QHS  . pantoprazole  40 mg Oral Daily  . sodium chloride flush  3 mL Intravenous Q12H    Family History    Family History  Problem Relation Age of Onset  . Heart disease Mother   . Heart disease Father   . Heart failure Daughter     Social History    Social History   Socioeconomic History  . Marital status: Widowed    Spouse name: Not on file  . Number of children: Not on file  . Years of education: Not on file  . Highest education level: Not on file  Occupational History  . Occupation: Retired  Engineer, production  . Financial resource strain: Not on file  . Food insecurity:    Worry: Not on file    Inability: Not on file  . Transportation needs:    Medical: Not on file    Non-medical: Not on file  Tobacco Use  . Smoking status: Never Smoker  . Smokeless tobacco: Never Used  Substance and Sexual Activity  . Alcohol use: No    Frequency: Never  . Drug use: No  . Sexual activity: Not Currently  Lifestyle  . Physical activity:    Days per week: Not on file    Minutes per session: Not on file  . Stress: Not on file  Relationships  . Social connections:    Talks on phone: Not on file    Gets together: Not on file    Attends religious service: Not on file    Active  member of club or organization: Not on file    Attends meetings of clubs or organizations: Not on file    Relationship status: Not on file  . Intimate partner violence:    Fear of current or ex partner: Not on file    Emotionally abused: Not on file    Physically abused: Not on file    Forced sexual activity: Not on file  Other Topics Concern  . Not on file  Social History Narrative  . Not on file     Review of Systems    See HPI  All other systems reviewed and are otherwise negative except as noted above.  Physical Exam    Blood pressure (!) 136/96, pulse 77, temperature 98.2 F (36.8 C), temperature source Oral, resp. rate 18, height 5\' 3"  (1.6 m), weight 160 lb (72.6 kg), SpO2 98 %.  General: Pleasant, older WF, NAD Psych: Normal affect. Neuro: Alert and oriented X 3. Moves all extremities spontaneously. HEENT: Normal  Neck: Supple without bruits or JVD. Lungs:  Resp regular and unlabored, CTA. Heart: RRR no s3, s4, or murmurs. Abdomen: Soft, non-tender, non-distended, BS + x 4.  Extremities: No clubbing, cyanosis or edema. DP/PT/Radials 2+ and equal bilaterally.  Labs    Troponin [facility] of Care Test) Recent Labs    03/03/18 0335  TROPIPOC 0.01   No results for input(s): CKTOTAL, CKMB, TROPONINI in the last 72 hours. Lab Results  Component Value Date   WBC 10.0 03/03/2018   HGB 12.5 03/03/2018   HCT 37.7 03/03/2018   MCV 91.3 03/03/2018   PLT 157 03/03/2018    Recent Labs  Lab 03/03/18 0330  NA 138  K 4.7  CL 106  CO2 23  BUN 12  CREATININE 0.76  CALCIUM 8.7*  GLUCOSE 141*   No results found for: CHOL, HDL, LDLCALC, TRIG No results found for: [facility]   Radiology Studies    Dg Chest 2 View  Result Date: 03/03/2018 CLINICAL DATA:  Shortness of breath tonight, status post cardioversion March 02, 2018. History of atrial fibrillation, hypertension. EXAM: CHEST - 2 VIEW COMPARISON:  Chest radiograph January 25, 2018 FINDINGS: Cardiac silhouette is  mildly enlarged. Mildly calcified aortic knob. Increasing interstitial prominence with Kerley B-lines. Trace posterior pleural effusion. No focal consolidation. No pneumothorax. Osteopenia. Accentuated thoracic kyphosis with severe degenerative change of the thoracolumbar junction. Surgical clips in the included right abdomen compatible with cholecystectomy. IMPRESSION: Cardiomegaly. Increasing interstitial edema with small pleural effusion. Electronically Signed   By: Awilda Metro M.D.   On: 03/03/2018  04:24    ECG & Cardiac Imaging    EKG:  The EKG was personally reviewed and demonstrates SR with nonspecific IVCD  Echo: 01/26/18  Study Conclusions  - Left ventricle: The cavity size was normal. Wall thickness was   increased in a pattern of mild LVH. Systolic function was normal.   The estimated ejection fraction was in the range of 55% to 60%.   Wall motion was normal; there were no regional wall motion   abnormalities. The study is not technically sufficient to allow   evaluation of LV diastolic function. Ejection fraction (MOD,   2-plane): 55%. - Aortic valve: Trileaflet. Sclerosis without stenosis. There was   trivial regurgitation. - Mitral valve: Mildly thickened leaflets . There was mild   regurgitation. - Left atrium: Severely dilated. - Right atrium: The atrium was mildly dilated. - Atrial septum: Thickened and calcified - ?prior closure or   amplatzer device. Negative for PFO. - Tricuspid valve: There was moderate regurgitation. - Pulmonic valve: Mildly thickened leaflets. There was mild   regurgitation. - Pulmonary arteries: PA peak pressure: 49 mm Hg (S). - Inferior vena cava: The vessel was dilated. The respirophasic   diameter changes were blunted (< 50%), consistent with elevated   central venous pressure.  Impressions:  - LVEF 55-60%, mild LVH, normal wall motion, trileaflet sclerotic   aortic valve, mild AI, mild MR, severe LAE, mild RAE, thickened    interatrial septum - ?prior closure, negative for PFO, moderate   TR, mild PI, RVSP 49 mmHg, dilated IVC.  Assessment & Plan    84 yo female with PMH of PAF, and HTN who presented with shortness of breath, requiring the use of Bipap.   1. Acute CHF with acute ARF: Suspect she has some degree of diastolic HF. Has had some shortness of breath since her admission back in 3/19 but no edema. Sudden onset of dyspnea this morning around 1:30am. BNP was elevated with edema on CXR. Now weaned from Bipap. Diuresing well with IV lasix. Echo ordered per admitting.  -- continue with IV lasix today, may be able to transition to oral dosing in the am  2. PAF: Underwent successful DCCV yesterday with conversion to SR. Remains on Dilt, coreg and Eliquis.   3. HTN: stable with current therapy.  Janice Coffin, NP-C Pager [phone] 03/03/2018, 12:45 PM  Agree with note by Laverda Page NP-C  84 y/o caucasian female pt of Dr Hochrein's s/p OP DCCV yesterday. On Eliquis. Nl LV fxn by 2D. Developed sudden onset of SOB at 1:00 am. Came to ER in Pulm edema requiring BiPAP and IV diuresis. I/O neg 2 L. On nasal canula now. Breathing better. In NSR. Prob diastolic HF, acute. Would continue IV lasix today and transition to PO tomorrow. Home 24-48 hrs. ROV with Dr Antoine Poche.   Runell Gess, M.D., FACP, [facility], [facility] [address] [place], [place]  [address]  [phone] 03/03/2018 1:53 PM

## 2018-03-03 NOTE — Progress Notes (Signed)
Pt taken off bipap per MD request and placed on 3L [place]. Pt denies SOB, no increased WOB, VS within normal limits. WIll continue to monitor for possible bipap needs again

## 2018-03-04 ENCOUNTER — Inpatient Hospital Stay (HOSPITAL_COMMUNITY): Payer: Medicare Other

## 2018-03-04 DIAGNOSIS — J810 Acute pulmonary edema: Secondary | ICD-10-CM

## 2018-03-04 LAB — CBC WITH DIFFERENTIAL/PLATELET
BASOS ABS: 0 10*3/uL (ref 0.0–0.1)
Basophils Relative: 1 %
EOS PCT: 3 %
Eosinophils Absolute: 0.3 10*3/uL (ref 0.0–0.7)
HEMATOCRIT: 35.8 % — AB (ref 36.0–46.0)
Hemoglobin: 11.6 g/dL — ABNORMAL LOW (ref 12.0–15.0)
LYMPHS PCT: 28 %
Lymphs Abs: 2.2 10*3/uL (ref 0.7–4.0)
MCH: 29.7 pg (ref 26.0–34.0)
MCHC: 32.4 g/dL (ref 30.0–36.0)
MCV: 91.8 fL (ref 78.0–100.0)
Monocytes Absolute: 0.8 10*3/uL (ref 0.1–1.0)
Monocytes Relative: 9 %
NEUTROS ABS: 4.7 10*3/uL (ref 1.7–7.7)
Neutrophils Relative %: 59 %
PLATELETS: 127 10*3/uL — AB (ref 150–400)
RBC: 3.9 MIL/uL (ref 3.87–5.11)
RDW: 14.5 % (ref 11.5–15.5)
WBC: 8 10*3/uL (ref 4.0–10.5)

## 2018-03-04 LAB — BASIC METABOLIC PANEL
ANION GAP: 7 (ref 5–15)
BUN: 11 mg/dL (ref 6–20)
CO2: 27 mmol/L (ref 22–32)
Calcium: 8.9 mg/dL (ref 8.9–10.3)
Chloride: 105 mmol/L (ref 101–111)
Creatinine, Ser: 0.72 mg/dL (ref 0.44–1.00)
GFR calc Af Amer: >60 mL/min (ref >60)
Glucose, Bld: 102 mg/dL — ABNORMAL HIGH (ref 65–99)
POTASSIUM: 2.8 mmol/L — AB (ref 3.5–5.1)
SODIUM: 139 mmol/L (ref 135–145)

## 2018-03-04 MED ORDER — POTASSIUM CHLORIDE CRYS ER 20 MEQ PO TBCR
40.0000 meq | EXTENDED_RELEASE_TABLET | Freq: Once | ORAL | Status: AC
  Administered 2018-03-04: 40 meq via ORAL
  Filled 2018-03-04: qty 2

## 2018-03-04 MED ORDER — FUROSEMIDE 40 MG PO TABS
40.0000 mg | ORAL_TABLET | Freq: Two times a day (BID) | ORAL | Status: DC
  Administered 2018-03-05 – 2018-03-06 (×3): 40 mg via ORAL
  Filled 2018-03-04 (×4): qty 1

## 2018-03-04 MED ORDER — MAGNESIUM SULFATE 2 GM/50ML IV SOLN
2.0000 g | Freq: Once | INTRAVENOUS | Status: AC
  Administered 2018-03-04: 2 g via INTRAVENOUS
  Filled 2018-03-04: qty 50

## 2018-03-04 NOTE — Evaluation (Signed)
Occupational Therapy Evaluation Patient Details Name: Samantha [name] MRN: [id number] DOB: 1927-11-01 Today's Date: 03/04/2018    History of Present Illness Pt is an 84 y/o female admitted secondary to increased SOB. Chest imaging showered increased interstitial edema with small pleural effusions. PMH includes blindness, a fib, HTN, and CHF.    Clinical Impression   Pt is supervised for showering and assisted for housekeeping. She walks with a walker and has a hx of falls. Pt has low vision as a result of macular degeneration and glaucoma. Pt currently requires min assist for OOB mobility and ADL. She has generalized weakness and poor standing balance. Pt agrees she needs ST rehab prior to return home. Recommending SNF.    Follow Up Recommendations  SNF;Supervision/Assistance - 24 hour    Equipment Recommendations       Recommendations for Other Services       Precautions / Restrictions Precautions Precautions: Fall Restrictions Weight Bearing Restrictions: No      Mobility Bed Mobility Overal bed mobility: Needs Assistance Bed Mobility: Supine to Sit;Sit to Supine     Supine to sit: Supervision Sit to supine: Supervision   General bed mobility comments: Supervision for safety. Increased time to perform.   Transfers Overall transfer level: Needs assistance Equipment used: Rolling walker (2 wheeled) Transfers: Sit to/from Stand Sit to Stand: Min assist         General transfer comment: Min A for lift assist and steadying to stand. Demonstrated safe hand placement.     Balance Overall balance assessment: Needs assistance Sitting-balance support: No upper extremity supported;Feet supported Sitting balance-Leahy Scale: Fair     Standing balance support: Bilateral upper extremity supported;During functional activity Standing balance-Leahy Scale: Poor Standing balance comment: Reliant on BUE support and external support for balance.                             ADL either performed or assessed with clinical judgement   ADL Overall ADL's : Needs assistance/impaired Eating/Feeding: Set up;Sitting   Grooming: Wash/dry hands;Standing;Minimal assistance   Upper Body Bathing: Supervision/ safety;Set up;Sitting   Lower Body Bathing: Minimal assistance;Sit to/from stand   Upper Body Dressing : Minimal assistance;Sitting   Lower Body Dressing: Minimal assistance;Sit to/from stand   Toilet Transfer: Minimal assistance;Ambulation;RW   Toileting- Clothing Manipulation and Hygiene: Minimal assistance;Sit to/from stand       Functional mobility during ADLs: Minimal assistance;Rolling walker       Vision Baseline Vision/History: Glaucoma;Macular Degeneration Patient Visual Report: No change from baseline Additional Comments: low vision     Perception     Praxis      Pertinent Vitals/Pain Pain Assessment: No/denies pain     Hand Dominance Right   Extremity/Trunk Assessment Upper Extremity Assessment Upper Extremity Assessment: Overall WFL for tasks assessed;Generalized weakness   Lower Extremity Assessment Lower Extremity Assessment: Defer to PT evaluation   Cervical / Trunk Assessment Cervical / Trunk Assessment: Kyphotic   Communication Communication Communication: HOH   Cognition Arousal/Alertness: Awake/alert Behavior During Therapy: WFL for tasks assessed/performed Overall Cognitive Status: Within Functional Limits for tasks assessed                                     General Comments  Spoke with pt and pt's daughter over the phone; both are very concerned about current mobility deficits and interested in short term  SNF.     Exercises     Shoulder Instructions      Home Living Family/patient expects to be discharged to:: Private residence Living Arrangements: Alone Available Help at Discharge: Family;Available 24 hours/day Type of Home: Apartment Home Access: Level entry     Home Layout:  One level     Bathroom Shower/Tub: Producer, television/film/videoWalk-in shower   Bathroom Toilet: Handicapped height     Home Equipment: Emergency planning/management officerhower seat;Walker - 4 wheels;Cane - single point;Grab bars - tub/shower   Additional Comments: Lives in 2-room apartment at independent living facility      Prior Functioning/Environment Level of Independence: Needs assistance  Gait / Transfers Assistance Needed: Uses rollator for ambulation ADL's / Homemaking Assistance Needed: Reports daughter provides supervision for bathing tasks.    Comments: pt has microwave and oven adapted for low vision        OT Problem List: Decreased activity tolerance;Decreased strength;Impaired balance (sitting and/or standing);Impaired vision/perception;Decreased knowledge of use of DME or AE      OT Treatment/Interventions:      OT Goals(Current goals can be found in the care plan section) Acute Rehab OT Goals Patient Stated Goal: to get stronger before going home  OT Goal Formulation: With patient  OT Frequency:     Barriers to D/C:            Co-evaluation              AM-PAC PT "6 Clicks" Daily Activity     Outcome Measure Help from another person eating meals?: A Little Help from another person taking care of personal grooming?: A Little Help from another person toileting, which includes using toliet, bedpan, or urinal?: A Little Help from another person bathing (including washing, rinsing, drying)?: A Little Help from another person to put on and taking off regular upper body clothing?: A Little Help from another person to put on and taking off regular lower body clothing?: A Little 6 Click Score: 18   End of Session Equipment Utilized During Treatment: Gait belt;Rolling walker  Activity Tolerance: Patient tolerated treatment well Patient left: in bed;with call bell/phone within reach  OT Visit Diagnosis: Unsteadiness on feet (R26.81);Other abnormalities of gait and mobility (R26.89);Low vision, both eyes  (H54.2);History of falling (Z91.81)                Time: 1547-1610 OT Time Calculation (min): 23 min Charges:  OT General Charges $OT Visit: 1 Visit OT Evaluation $OT Eval Moderate Complexity: 1 Mod OT Treatments $Self Care/Home Management : 8-22 mins G-Codes:     03/04/2018 Martie RoundJulie Shine Mikes, OTR/L Pager: [phone]  Iran PlanasMayberry, [place] BailiffJulie Lynn 03/04/2018, 4:23 PM

## 2018-03-04 NOTE — Progress Notes (Addendum)
Progress Note  Patient Name: Samantha [name] Date of Encounter: 03/04/2018  Primary Cardiologist:   Rollene RotundaJames Terisha Losasso, MD   Subjective   She is breathing better and she reports that she is at her baseline.    Inpatient Medications    Scheduled Meds: . apixaban  5 mg Oral BID  . carvedilol  25 mg Oral BID WC  . diltiazem  120 mg Oral Daily  . furosemide  40 mg Intravenous BID  . hyoscyamine  0.125 mg Oral QPM  . latanoprost  1 drop Both Eyes QHS  . pantoprazole  40 mg Oral Daily  . sodium chloride flush  3 mL Intravenous Q12H   Continuous Infusions: . sodium chloride     PRN Meds: sodium chloride, acetaminophen, ondansetron (ZOFRAN) IV, sodium chloride flush   Vital Signs    Vitals:   03/03/18 1730 03/03/18 2314 03/04/18 0350 03/04/18 0752  BP: 95/66 105/66  119/66  Pulse: 65 67  62  Resp:  16  19  Temp:  (!) 97.3 F (36.3 C)  97.8 F (36.6 C)  TempSrc:  Oral  Oral  SpO2: 97% 95%  97%  Weight:   151 lb 7.3 oz (68.7 kg)   Height:        Intake/Output Summary (Last 24 hours) at 03/04/2018 1305 Last data filed at 03/04/2018 1200 Gross per 24 hour  Intake 360 ml  Output 1700 ml  Net -1340 ml   Filed Weights   03/03/18 0317 03/04/18 0350  Weight: 160 lb (72.6 kg) 151 lb 7.3 oz (68.7 kg)    Telemetry    Irregular, probable recurrent atrial fib - Personally Reviewed  ECG    NA - Personally Reviewed  Physical Exam   GEN: No acute distress.   Neck: No  JVD Cardiac: Irregular RR, no murmurs, rubs, or gallops.  Respiratory: Clear  to auscultation bilaterally. GI: Soft, nontender, non-distended  MS: No  edema; No deformity. Neuro:  Nonfocal  Psych: Normal affect   Labs    Chemistry Recent Labs  Lab 02/25/18 1508 03/03/18 0330 03/04/18 0257  NA 147* 138 139  K 3.6 4.7 2.8*  CL 103 106 105  CO2 31* 23 27  GLUCOSE 105* 141* 102*  BUN 12 12 11   CREATININE 0.84 0.76 0.72  CALCIUM 10.7* 8.7* 8.9  GFRNONAA 62 >60 >60  GFRAA 72 >60 >60  ANIONGAP   --  9 7     Hematology Recent Labs  Lab 02/25/18 1508 03/03/18 0330 03/04/18 0257  WBC 8.9 10.0 8.0  RBC 4.40 4.13 3.90  HGB 12.9 12.5 11.6*  HCT 40.8 37.7 35.8*  MCV 93 91.3 91.8  MCH 29.3 30.3 29.7  MCHC 31.6 33.2 32.4  RDW 14.4 14.8 14.5  PLT 177 157 127*    Cardiac EnzymesNo results for input(s): TROPONINI in the last 168 hours.  Recent Labs  Lab 03/03/18 0335  TROPIPOC 0.01     BNP Recent Labs  Lab 03/03/18 0437  BNP 158.2*     DDimer No results for input(s): DDIMER in the last 168 hours.   Radiology    Dg Chest 2 View  Result Date: 03/03/2018 CLINICAL DATA:  Shortness of breath tonight, status post cardioversion March 02, 2018. History of atrial fibrillation, hypertension. EXAM: CHEST - 2 VIEW COMPARISON:  Chest radiograph January 25, 2018 FINDINGS: Cardiac silhouette is mildly enlarged. Mildly calcified aortic knob. Increasing interstitial prominence with Kerley B-lines. Trace posterior pleural effusion. No focal consolidation. No pneumothorax.  Osteopenia. Accentuated thoracic kyphosis with severe degenerative change of the thoracolumbar junction. Surgical clips in the included right abdomen compatible with cholecystectomy. IMPRESSION: Cardiomegaly. Increasing interstitial edema with small pleural effusion. Electronically Signed   By: Awilda Metro M.D.   On: 03/03/2018 04:24    Cardiac Studies   Echo pending.   Patient Profile     84 y.o. female with PMH of PAF, and HTN who presented with shortness of breath, requiring the use of Bipap.    Assessment & Plan    ACUTE DIASTOLIC HF:    Minus 2.26 liters IV fluid since admission. I will change back to PO diuretic.  I took her O2 off and asked the nurse to check sat.   She had an echo earlier this year.  I do not think that another echo is needed as an in patient.  PAF:  She was in sinus when I entered the room.  She appears to be in atrial fib but the rate is controlled.  I will check an EKG.   HTN:  BP  is well controlled.    HYPOKALEMIA:   Given 80 meq today.  I will add another 40 meq Kdur PO.    For questions or updates, please contact [facility] Please consult [email or web address] for contact info under Cardiology/STEMI.   Signed, Rollene Rotunda, MD  03/04/2018, 1:05 PM

## 2018-03-04 NOTE — Progress Notes (Signed)
PROGRESS NOTE    Samantha [name]  WGN:[id number] DOB: Apr 08, 1927 DOA: 03/03/2018 PCP: Hadley Penobbins, Robert A, MD   Brief Narrative:  [age] year old female with history of essential hypertension, paroxysmal atrial fibrillation was recently admitted here in March 2019 for new onset atrial fibrillation with RVR.  At that time she was placed on Cardizem and Eliquis and eventually discharged.  She was scheduled outpatient appointment with cardiology and underwent successful cardioversion on March 02, 2018 with 1 shock with the following day patient acutely became short of breath and presented to the hospital. Patient was admitted for pulmonary edema likely secondary to acute congestive heart failure exacerbationy.  Overnight patient was diuresed with IV Lasix and her symptoms improved.  During her stay patient reported of increase in weakness and deconditioning therefore was evaluated by physical therapy.   Assessment & Plan:   Principal Problem:   Acute respiratory failure (HCC) Active Problems:   Atrial fibrillation (HCC)   HTN (hypertension)   Acute CHF (HCC)   Hyperglycemia  Acute respiratory distress likely from fluid overload Atrial fibrillation status post cardioversion on March 02, 2018 - At this time patient appears to be close to euvolemic therefore we will transition her IV Lasix to oral Lasix when ok with cardiology.  -Provide supplemental oxygen as deemed necessary -We will continue her home regimen of Coreg  Paroxysmal atrial fibrillation - At time patient remains on Cardizem, Coreg and Eliquis.  Essential hypertension -Continue home medications  Generalized weakness and deconditioning -Physical therapy and occupational therapy ordered   DVT prophylaxis: Eliquis Code Status: DO NOT RESUSCITATE Family Communication: Son at bedside Disposition Plan: Likely discharge in next 24 hours  Consultants:   Cardiology  Procedures:   None inpatient  Antimicrobials:    None   Subjective: Patient had one episode of diarrhea this morning but states she feels much better now.  Son brings up a concern that patient has been requiring increasing amount of help at home which her daughter is not able to provide.  At this time patient is getting outpatient home physical therapy but does not think is sufficient.  Review of Systems Otherwise negative except as per HPI, including: General: Denies fever, chills, night sweats or unintended weight loss. Resp: Denies cough, wheezing, shortness of breath. Cardiac: Denies chest pain, palpitations, orthopnea, paroxysmal nocturnal dyspnea. GI: Denies abdominal pain, nausea, vomiting, constipation GU: Denies dysuria, frequency, hesitancy or incontinence MS: Denies muscle aches, joint pain or swelling Neuro: Denies headache, neurologic deficits (focal weakness, numbness, tingling), abnormal gait Psych: Denies anxiety, depression, SI/HI/AVH Skin: Denies new rashes or lesions ID: Denies sick contacts, exotic exposures, travel  Objective: Vitals:   03/03/18 1730 03/03/18 2314 03/04/18 0350 03/04/18 0752  BP: 95/66 105/66  119/66  Pulse: 65 67  62  Resp:  16  19  Temp:  (!) 97.3 F (36.3 C)  97.8 F (36.6 C)  TempSrc:  Oral  Oral  SpO2: 97% 95%  97%  Weight:   68.7 kg (151 lb 7.3 oz)   Height:        Intake/Output Summary (Last 24 hours) at 03/04/2018 1115 Last data filed at 03/04/2018 0900 Gross per 24 hour  Intake 360 ml  Output 1500 ml  Net -1140 ml   Filed Weights   03/03/18 0317 03/04/18 0350  Weight: 72.6 kg (160 lb) 68.7 kg (151 lb 7.3 oz)    Examination:  General exam: Appears calm and comfortable, elderly frail-appearing Respiratory system: Some diminished breath sounds at the bases  Cardiovascular system: S1 & S2 heard, RRR. No JVD, murmurs, rubs, gallops or clicks. No pedal edema. Gastrointestinal system: Abdomen is nondistended, soft and nontender. No organomegaly or masses felt. Normal bowel  sounds heard. Central nervous system: Alert and oriented. No focal neurological deficits. Extremities: Symmetric 5 x 5 power. Skin: No rashes, lesions or ulcers Psychiatry: Judgement and insight appear normal. Mood & affect appropriate.     Data Reviewed:   CBC: Recent Labs  Lab 02/25/18 1508 03/03/18 0330 03/04/18 0257  WBC 8.9 10.0 8.0  NEUTROABS  --   --  4.7  HGB 12.9 12.5 11.6*  HCT 40.8 37.7 35.8*  MCV 93 91.3 91.8  PLT 177 157 127*   Basic Metabolic Panel: Recent Labs  Lab 02/25/18 1508 03/03/18 0330 03/04/18 0257  NA 147* 138 139  K 3.6 4.7 2.8*  CL 103 106 105  CO2 31* 23 27  GLUCOSE 105* 141* 102*  BUN 12 12 11   CREATININE 0.84 0.76 0.72  CALCIUM 10.7* 8.7* 8.9   GFR: Estimated Creatinine Clearance: 45.2 mL/min (by C-G formula based on SCr of 0.72 mg/dL). Liver Function Tests: No results for input(s): AST, ALT, ALKPHOS, BILITOT, PROT, ALBUMIN in the last 168 hours. No results for input(s): LIPASE, AMYLASE in the last 168 hours. No results for input(s): AMMONIA in the last 168 hours. Coagulation Profile: No results for input(s): INR, PROTIME in the last 168 hours. Cardiac Enzymes: No results for input(s): CKTOTAL, CKMB, CKMBINDEX, TROPONINI in the last 168 hours. BNP (last 3 results) No results for input(s): PROBNP in the last 8760 hours. HbA1C: No results for input(s): HGBA1C in the last 72 hours. CBG: No results for input(s): GLUCAP in the last 168 hours. Lipid Profile: No results for input(s): CHOL, HDL, LDLCALC, TRIG, CHOLHDL, LDLDIRECT in the last 72 hours. Thyroid Function Tests: No results for input(s): TSH, T4TOTAL, FREET4, T3FREE, THYROIDAB in the last 72 hours. Anemia Panel: No results for input(s): VITAMINB12, FOLATE, FERRITIN, TIBC, IRON, RETICCTPCT in the last 72 hours. Sepsis Labs: No results for input(s): PROCALCITON, LATICACIDVEN in the last 168 hours.  No results found for this or any previous visit (from the past 240 hour(s)).        Radiology Studies: Dg Chest 2 View  Result Date: 03/03/2018 CLINICAL DATA:  Shortness of breath tonight, status post cardioversion March 02, 2018. History of atrial fibrillation, hypertension. EXAM: CHEST - 2 VIEW COMPARISON:  Chest radiograph January 25, 2018 FINDINGS: Cardiac silhouette is mildly enlarged. Mildly calcified aortic knob. Increasing interstitial prominence with Kerley B-lines. Trace posterior pleural effusion. No focal consolidation. No pneumothorax. Osteopenia. Accentuated thoracic kyphosis with severe degenerative change of the thoracolumbar junction. Surgical clips in the included right abdomen compatible with cholecystectomy. IMPRESSION: Cardiomegaly. Increasing interstitial edema with small pleural effusion. Electronically Signed   By: Awilda Metro M.D.   On: 03/03/2018 04:24        Scheduled Meds: . apixaban  5 mg Oral BID  . carvedilol  25 mg Oral BID WC  . diltiazem  120 mg Oral Daily  . furosemide  40 mg Intravenous BID  . hyoscyamine  0.125 mg Oral QPM  . latanoprost  1 drop Both Eyes QHS  . pantoprazole  40 mg Oral Daily  . potassium chloride  40 mEq Oral Once  . sodium chloride flush  3 mL Intravenous Q12H   Continuous Infusions: . sodium chloride       LOS: 1 day    Time spent: 32 mins  Ankit Joline Maxcy, MD [facility] Pager [phone]   If 7PM-7AM, please contact night-coverage [email or web address] Password [email or web address] 03/04/2018, 11:15 AM

## 2018-03-04 NOTE — Evaluation (Addendum)
Physical Therapy Evaluation Patient Details Name: Samantha [name] MRN: [id number] DOB: 10/13/1927 Today's Date: 03/04/2018   History of Present Illness  Pt is an 84 y/o female admitted secondary to increased SOB. Chest imaging showered increased interstitial edema with small pleural effusions. PMH includes blindness, a fib, HTN, and CHF.   Clinical Impression  Pt admitted secondary to problem above with deficits below. Pt reports increased weakness and unsteadiness and required min A for steadying throughout gait with RW. Pt limited in mobility tolerance secondary to increased fatigue. Oxygen sats dropped to 88% on RA during gait, however, returned to 92% on RA with standing rest and pursed lip breathing. Pt and daughter concerned about current mobility deficits and feel pt is a high fall risk. Feel pt would benefit from short term SNF at d/c prior to return home to increase independence and safety. Will continue to follow acutely.     Follow Up Recommendations SNF;Supervision/Assistance - 24 hour    Equipment Recommendations  None recommended by PT    Recommendations for Other Services       Precautions / Restrictions Precautions Precautions: Fall Restrictions Weight Bearing Restrictions: No      Mobility  Bed Mobility Overal bed mobility: Needs Assistance Bed Mobility: Supine to Sit;Sit to Supine     Supine to sit: Supervision Sit to supine: Supervision   General bed mobility comments: Supervision for safety. Increased time to perform.   Transfers Overall transfer level: Needs assistance Equipment used: Rolling walker (2 wheeled) Transfers: Sit to/from Stand Sit to Stand: Min assist         General transfer comment: Min A for lift assist and steadying to stand. Demonstrated safe hand placement.   Ambulation/Gait Ambulation/Gait assistance: Min assist Ambulation Distance (Feet): 40 Feet Assistive device: Rolling walker (2 wheeled) Gait Pattern/deviations:  Step-through pattern;Decreased stride length;Trunk flexed Gait velocity: Decreased  Gait velocity interpretation: <1.31 ft/sec, indicative of household ambulator General Gait Details: Slow, unsteady gait, requiring consistent min A for steadying. Pt reports increased muscle fatigue and weakness which limited distance. Verbal cues for sequencing using RW. Pt sats decreased to 88% on RA, however, with standing rest and pursed lip breathing increased to 92% on RA.   Stairs            Wheelchair Mobility    Modified Rankin (Stroke Patients Only)       Balance Overall balance assessment: Needs assistance Sitting-balance support: No upper extremity supported;Feet supported Sitting balance-Leahy Scale: Fair     Standing balance support: Bilateral upper extremity supported;During functional activity Standing balance-Leahy Scale: Poor Standing balance comment: Reliant on BUE support and external support for balance.                              Pertinent Vitals/Pain Pain Assessment: No/denies pain    Home Living Family/patient expects to be discharged to:: Private residence Living Arrangements: Children Available Help at Discharge: Family;Available 24 hours/day Type of Home: House Home Access: Ramped entrance     Home Layout: One level Home Equipment: Emergency planning/management officer - 4 wheels;Cane - single point;Grab bars - tub/shower      Prior Function Level of Independence: Needs assistance   Gait / Transfers Assistance Needed: Uses rollator for ambulation  ADL's / Homemaking Assistance Needed: Reports daughter provides supervision for bathing tasks.         Hand Dominance   Dominant Hand: Right    Extremity/Trunk Assessment   Upper  Extremity Assessment Upper Extremity Assessment: Defer to OT evaluation    Lower Extremity Assessment Lower Extremity Assessment: Generalized weakness    Cervical / Trunk Assessment Cervical / Trunk Assessment: Kyphotic   Communication   Communication: HOH  Cognition Arousal/Alertness: Awake/alert Behavior During Therapy: WFL for tasks assessed/performed Overall Cognitive Status: Within Functional Limits for tasks assessed                                        General Comments General comments (skin integrity, edema, etc.): Spoke with pt and pt's daughter over the phone; both are very concerned about current mobility deficits and interested in short term SNF.     Exercises     Assessment/Plan    PT Assessment Patient needs continued PT services  PT Problem List Decreased strength;Decreased balance;Decreased activity tolerance;Decreased mobility;Decreased knowledge of use of DME       PT Treatment Interventions DME instruction;Gait training;Functional mobility training;Therapeutic activities;Therapeutic exercise;Balance training;Neuromuscular re-education;Patient/family education    PT Goals (Current goals can be found in the Care Plan section)  Acute Rehab PT Goals Patient Stated Goal: to get stronger before going home  PT Goal Formulation: With patient Time For Goal Achievement: 03/18/18 Potential to Achieve Goals: Good    Frequency Min 2X/week   Barriers to discharge        Co-evaluation               AM-PAC PT "6 Clicks" Daily Activity  Outcome Measure Difficulty turning over in bed (including adjusting bedclothes, sheets and blankets)?: A Little Difficulty moving from lying on back to sitting on the side of the bed? : A Little Difficulty sitting down on and standing up from a chair with arms (e.g., wheelchair, bedside commode, etc,.)?: Unable Help needed moving to and from a bed to chair (including a wheelchair)?: A Little Help needed walking in hospital room?: A Little Help needed climbing 3-5 steps with a railing? : A Lot 6 Click Score: 15    End of Session Equipment Utilized During Treatment: Gait belt Activity Tolerance: Patient limited by  fatigue Patient left: in bed;with call bell/phone within reach;with family/visitor present Nurse Communication: Mobility status PT Visit Diagnosis: Unsteadiness on feet (R26.81);Muscle weakness (generalized) (M62.81)    Time: [id number] PT Time Calculation (min) (ACUTE ONLY): 25 min   Charges:   PT Evaluation $PT Eval Low Complexity: 1 Low PT Treatments $Gait Training: 8-22 mins   PT G Codes:        Gladys DammeBrittany Browning Southwood, PT, DPT  Acute Rehabilitation Services  Pager: [phone]   Lehman PromBrittany S Ingris Pasquarella 03/04/2018, 3:44 PM

## 2018-03-05 ENCOUNTER — Telehealth: Payer: Self-pay | Admitting: Cardiology

## 2018-03-05 ENCOUNTER — Inpatient Hospital Stay (HOSPITAL_COMMUNITY): Payer: Medicare Other

## 2018-03-05 DIAGNOSIS — I509 Heart failure, unspecified: Secondary | ICD-10-CM

## 2018-03-05 DIAGNOSIS — R0600 Dyspnea, unspecified: Secondary | ICD-10-CM

## 2018-03-05 LAB — BASIC METABOLIC PANEL
ANION GAP: 8 (ref 5–15)
BUN: 14 mg/dL (ref 6–20)
CALCIUM: 9 mg/dL (ref 8.9–10.3)
CO2: 28 mmol/L (ref 22–32)
Chloride: 103 mmol/L (ref 101–111)
Creatinine, Ser: 0.99 mg/dL (ref 0.44–1.00)
GFR, EST AFRICAN AMERICAN: 57 mL/min — AB (ref >60)
GFR, EST NON AFRICAN AMERICAN: 49 mL/min — AB (ref >60)
Glucose, Bld: 118 mg/dL — ABNORMAL HIGH (ref 65–99)
Potassium: 4.3 mmol/L (ref 3.5–5.1)
SODIUM: 139 mmol/L (ref 135–145)

## 2018-03-05 LAB — ECHOCARDIOGRAM LIMITED
HEIGHTINCHES: 63 in
WEIGHTICAEL: 2373.91 [oz_av]

## 2018-03-05 LAB — MAGNESIUM: MAGNESIUM: 1.9 mg/dL (ref 1.7–2.4)

## 2018-03-05 MED ORDER — POTASSIUM CHLORIDE ER 10 MEQ PO TBCR: 20.0000 meq | EXTENDED_RELEASE_TABLET | Freq: Every day | ORAL | 0 refills | Status: DC

## 2018-03-05 MED ORDER — TUBERCULIN PPD 5 UNIT/0.1ML ID SOLN
5.0000 [IU] | Freq: Once | INTRADERMAL | Status: DC
  Administered 2018-03-05: 5 [IU] via INTRADERMAL
  Filled 2018-03-05: qty 0.1

## 2018-03-05 MED ORDER — FUROSEMIDE 40 MG PO TABS: 40.0000 mg | ORAL_TABLET | Freq: Two times a day (BID) | ORAL | 0 refills | Status: DC

## 2018-03-05 MED ORDER — METOPROLOL TARTRATE 5 MG/5ML IV SOLN
5.0000 mg | Freq: Once | INTRAVENOUS | Status: AC
  Administered 2018-03-05: 5 mg via INTRAVENOUS
  Filled 2018-03-05: qty 5

## 2018-03-05 NOTE — Discharge Summary (Addendum)
Physician Discharge Summary  Samantha [name] OZH:[id number] DOB: May 26, 1927 DOA: 03/03/2018  PCP: Hadley Pen, MD  Admit date: 03/03/2018 Discharge date: 03/05/2018  Admitted From: Home  Disposition: Skilled nursing  Update 03/06/18 Patient had to be kept overnight as she went into atrial fibrillation with RVR for a brief period of time but remained hemodynamically stable.  Now over the last 12 hours she has remained stable with heart rate less than 80.  Today she has reached maximum benefit from hospital stay and would benefit from going to skilled nursing facility.  Arrangements will be made.  Recommendations for Outpatient Follow-up:  1. Follow up with PCP in 1-2 weeks 2. Please obtain BMP/CBC in one week your next doctors visit.  3. Follow up with cardiology in 2 weeks.   Home Health: PT Equipment/Devices: None Discharge Condition: Stable CODE STATUS: DNR Diet recommendation: 2g Salt and cardiac diet.    Brief/Interim Summary:  84 year old female with history of essential hypertension, paroxysmal atrial fibrillation was recently admitted here in March 2019 for new onset atrial fibrillation with RVR.  At that time she was placed on Cardizem and Eliquis and eventually discharged.  She was scheduled outpatient appointment with cardiology and underwent successful cardioversion on March 02, 2018 with 1 shock with the following day patient acutely became short of breath and presented to the hospital. Patient was admitted for pulmonary edema likely secondary to acute congestive heart failure exacerbationy.  Overnight patient was diuresed with IV Lasix and her symptoms improved.  During her stay patient reported of increase in weakness and deconditioning therefore was evaluated by physical therapy. PT recommended patient would benefit from SNF. Today patient has reached maximum benefit from an hospital stay and stable to be discharged with outpatient follow-up recommendations as stated above.   Her Lasix has been switched to IV by the cardiologist and potassium chloride supplement has been added. Initially on the day of discharge patient went into atrial fibrillation with RVR for a brief period of time therefore was kept an extra night for closer monitoring.  This morning remains stable without any complaints and her heart rate is well controlled on home medications.  No complaints this morning, patient states she is ready to be discharged.  HEENT/EYES = negative for pain, redness, loss of vision, double vision, blurred vision, loss of hearing, sore throat, hoarseness, dysphagia Cardiovascular= negative for chest pain, palpitation, murmurs, lower extremity swelling Respiratory/lungs= negative for shortness of breath, cough, hemoptysis, wheezing, mucus production Gastrointestinal= negative for nausea, vomiting,, abdominal pain, melena, hematemesis Genitourinary= negative for Dysuria, Hematuria, Change in Urinary Frequency MSK = Negative for arthralgia, myalgias, Back Pain, Joint swelling  Neurology= Negative for headache, seizures, numbness, tingling  Psychiatry= Negative for anxiety, depression, suicidal and homocidal ideation Allergy/Immunology= Medication/Food allergy as listed  Skin= Negative for Rash, lesions, ulcers, itching    Discharge Diagnoses:  Principal Problem:   Acute respiratory failure (HCC) Active Problems:   Atrial fibrillation (HCC)   HTN (hypertension)   Acute CHF (HCC)   Hyperglycemia   Acute pulmonary edema (HCC)   Acute respiratory distress likely from fluid overload, improved Atrial fibrillation status post cardioversion on March 02, 2018 -During patient stay she was diuresed with IV Lasix and was total -2.4 L.  Her breathing symptoms improved greatly and was weaned off oxygen.  Her IV Lasix was transitioned to oral Lasix 40 mg twice daily with recommendations of the cardiologist.  Potassium chloride supplement has been added and she needs to follow-up  outpatient -  We will continue her home regimen of Coreg  Paroxysmal atrial fibrillation - At time patient remains on Cardizem, Coreg and Eliquis.  Essential hypertension -Continue home medications  Generalized weakness and deconditioning -Physical therapy and occupational therapy-recommended skilled nursing facility   DVT prophylaxis: Eliquis Code Status: DO NOT RESUSCITATE     Discharge Instructions   Allergies as of 03/05/2018      Reactions   Tape Rash, Other (See Comments)   Please use paper tape      Medication List    TAKE these medications   acetaminophen 500 MG tablet Commonly known as:  TYLENOL Take 500 mg by mouth every 6 (six) hours as needed for moderate pain or headache.   apixaban 5 MG Tabs tablet Commonly known as:  ELIQUIS Take 1 tablet (5 mg total) by mouth 2 (two) times daily.   carvedilol 25 MG tablet Commonly known as:  COREG Take 25 mg by mouth 2 (two) times daily.   cholecalciferol 1000 units tablet Commonly known as:  VITAMIN D Take 1,000 Units by mouth daily.   D-Mannose Powd Take 5 mLs by mouth daily. Mix in 8 oz water and drink   diltiazem 120 MG 24 hr capsule Commonly known as:  CARDIZEM CD Take 1 capsule (120 mg total) by mouth daily.   Fish Oil 1000 MG Caps Take 1,000 mg by mouth daily.   furosemide 40 MG tablet Commonly known as:  LASIX Take 1 tablet (40 mg total) by mouth 2 (two) times daily.   hydrocortisone 2.5 % cream Apply 1 application topically 2 (two) times daily.   hyoscyamine 0.125 MG SL tablet Commonly known as:  LEVSIN SL Take 0.125 mg by mouth every evening.   ICAPS PO Take 1 capsule by mouth 2 (two) times daily.   latanoprost 0.005 % ophthalmic solution Commonly known as:  XALATAN Place 1 drop into both eyes at bedtime.   pantoprazole 40 MG tablet Commonly known as:  PROTONIX Take 40 mg by mouth daily.   potassium chloride 10 MEQ tablet Commonly known as:  K-DUR Take 2 tablets (20 mEq total)  by mouth daily.   SYSTANE OP Place 1 drop into both eyes 2 (two) times daily.      Follow-up Information    Hadley Pen, MD Follow up in 1 week(s).   Specialty:  Family Medicine Contact information: [address] [place] [address] [phone]        Rollene Rotunda, MD Follow up in 2 week(s).   Specialty:  Cardiology Contact information: [address] [place] [address] [phone]          Allergies  Allergen Reactions  . Tape Rash and Other (See Comments)    Please use paper tape    You were cared for by a hospitalist during your hospital stay. If you have any questions about your discharge medications or the care you received while you were in the hospital after you are discharged, you can call the unit and asked to speak with the hospitalist on call if the hospitalist that took care of you is not available. Once you are discharged, your primary care physician will handle any further medical issues. Please note that no refills for any discharge medications will be authorized once you are discharged, as it is imperative that you return to your primary care physician (or establish a relationship with a primary care physician if you do not have one) for your aftercare needs  so that they can reassess your need for medications and monitor your lab values.  Consultations:  Cardiology   Procedures/Studies: Dg Chest 2 View  Result Date: 03/03/2018 CLINICAL DATA:  Shortness of breath tonight, status post cardioversion March 02, 2018. History of atrial fibrillation, hypertension. EXAM: CHEST - 2 VIEW COMPARISON:  Chest radiograph January 25, 2018 FINDINGS: Cardiac silhouette is mildly enlarged. Mildly calcified aortic knob. Increasing interstitial prominence with Kerley B-lines. Trace posterior pleural effusion. No focal consolidation. No pneumothorax. Osteopenia. Accentuated thoracic kyphosis with severe degenerative change of the  thoracolumbar junction. Surgical clips in the included right abdomen compatible with cholecystectomy. IMPRESSION: Cardiomegaly. Increasing interstitial edema with small pleural effusion. Electronically Signed   By: Awilda Metro M.D.   On: 03/03/2018 04:24      Subjective:   Discharge Exam: Vitals:   03/04/18 1642 03/04/18 2300  BP: 95/66 (!) 110/55  Pulse: 70 73  Resp:    Temp:  97.8 F (36.6 C)  SpO2: 94% 95%   Vitals:   03/04/18 0752 03/04/18 1642 03/04/18 2300 03/05/18 0500  BP: 119/66 95/66 (!) 110/55   Pulse: 62 70 73   Resp: 19     Temp: 97.8 F (36.6 C)  97.8 F (36.6 C)   TempSrc: Oral  Oral   SpO2: 97% 94% 95%   Weight:    67.3 kg (148 lb 5.9 oz)  Height:        General: Pt is alert, awake, not in acute distress Cardiovascular: RRR, S1/S2 +, no rubs, no gallops Respiratory: CTA bilaterally, no wheezing, no rhonchi Abdominal: Soft, NT, ND, bowel sounds + Extremities: no edema, no cyanosis    The results of significant diagnostics from this hospitalization (including imaging, microbiology, ancillary and laboratory) are listed below for reference.     Microbiology: No results found for this or any previous visit (from the past 240 hour(s)).   Labs: BNP (last 3 results) Recent Labs    01/25/18 1743 03/03/18 0437  BNP 663.0* 158.2*   Basic Metabolic Panel: Recent Labs  Lab 03/03/18 0330 03/04/18 0257 03/05/18 0211  NA 138 139 139  K 4.7 2.8* 4.3  CL 106 105 103  CO2 23 27 28   GLUCOSE 141* 102* 118*  BUN 12 11 14   CREATININE 0.76 0.72 0.99  CALCIUM 8.7* 8.9 9.0  MG  --   --  1.9   Liver Function Tests: No results for input(s): AST, ALT, ALKPHOS, BILITOT, PROT, ALBUMIN in the last 168 hours. No results for input(s): LIPASE, AMYLASE in the last 168 hours. No results for input(s): AMMONIA in the last 168 hours. CBC: Recent Labs  Lab 03/03/18 0330 03/04/18 0257  WBC 10.0 8.0  NEUTROABS  --  4.7  HGB 12.5 11.6*  HCT 37.7 35.8*  MCV  91.3 91.8  PLT 157 127*   Cardiac Enzymes: No results for input(s): CKTOTAL, CKMB, CKMBINDEX, TROPONINI in the last 168 hours. BNP: Invalid input(s): POCBNP CBG: No results for input(s): GLUCAP in the last 168 hours. D-Dimer No results for input(s): DDIMER in the last 72 hours. Hgb A1c No results for input(s): HGBA1C in the last 72 hours. Lipid Profile No results for input(s): CHOL, HDL, LDLCALC, TRIG, CHOLHDL, LDLDIRECT in the last 72 hours. Thyroid function studies No results for input(s): TSH, T4TOTAL, T3FREE, THYROIDAB in the last 72 hours.  Invalid input(s): FREET3 Anemia work up No results for input(s): VITAMINB12, FOLATE, FERRITIN, TIBC, IRON, RETICCTPCT in the last 72 hours. Urinalysis    Component  Value Date/Time   COLORURINE YELLOW 01/26/2018 0800   APPEARANCEUR CLEAR 01/26/2018 0800   LABSPEC 1.009 01/26/2018 0800   PHURINE 7.0 01/26/2018 0800   GLUCOSEU NEGATIVE 01/26/2018 0800   HGBUR NEGATIVE 01/26/2018 0800   BILIRUBINUR NEGATIVE 01/26/2018 0800   KETONESUR 5 (A) 01/26/2018 0800   PROTEINUR NEGATIVE 01/26/2018 0800   NITRITE NEGATIVE 01/26/2018 0800   LEUKOCYTESUR SMALL (A) 01/26/2018 0800   Sepsis Labs Invalid input(s): PROCALCITONIN,  WBC,  LACTICIDVEN Microbiology No results found for this or any previous visit (from the past 240 hour(s)).  You were cared for by a hospitalist during your hospital stay. If you have any questions about your discharge medications or the care you received while you were in the hospital after you are discharged, you can call the unit and asked to speak with the hospitalist on call if the hospitalist that took care of you is not available. Once you are discharged, your primary care physician will handle any further medical issues. Please note that NO REFILLS for any discharge medications will be authorized once you are discharged, as it is imperative that you return to your primary care physician (or establish a relationship with  a primary care physician if you do not have one) for your aftercare needs so that they can reassess your need for medications and monitor your lab values.  Please request your Prim.MD to go over all Hospital Tests and Procedure/Radiological results at the follow up, please get all Hospital records sent to your Prim MD by signing hospital release before you go home.  Get CBC, CMP, 2 view Chest X ray checked  by Primary MD during your next visit or SNF MD in 5-7 days ( we routinely change or add medications that can affect your baseline labs and fluid status, therefore we recommend that you get the mentioned basic workup next visit with your PCP, your PCP may decide not to get them or add new tests based on their clinical decision)  On your next visit with your primary care physician please Get Medicines reviewed and adjusted.  If you experience worsening of your admission symptoms, develop shortness of breath, life threatening emergency, suicidal or homicidal thoughts you must seek medical attention immediately by calling 911 or calling your MD immediately  if symptoms less severe.  You Must read complete instructions/literature along with all the possible adverse reactions/side effects for all the Medicines you take and that have been prescribed to you. Take any new Medicines after you have completely understood and accpet all the possible adverse reactions/side effects.   Do not drive, operate heavy machinery, perform activities at heights, swimming or participation in water activities or provide baby sitting services if your were admitted for syncope or siezures until you have seen by Primary MD or a Neurologist and advised to do so again.  Do not drive when taking Pain medications.  Time coordinating discharge: 33 mins  SIGNED:   Dimple NanasAnkit Chirag Himmat Enberg, MD  [facility] 03/05/2018, 10:57 AM Pager   If 7PM-7AM, please contact night-coverage [email or web address] Password [email or web address]

## 2018-03-05 NOTE — Progress Notes (Signed)
HR in 150's per CCM. Paged Dr Nelson ChimesAmin  Per his order will give Lopressor 5 MG. Morning meds Coreg and Cardizem that were held due to low BP and HR earlier,  Were given per order.Will continue to monitor patient

## 2018-03-05 NOTE — Telephone Encounter (Signed)
New message   Pt c/o medication issue:  1. Name of Medication: Lasix and Potassium  2. How are you currently taking this medication (dosage and times per day)? As prescribed  3. Are you having a reaction (difficulty breathing--STAT)? No   4. What is your medication issue? [facility]. They do not use [facility]. Call [phone] [facility] / fax [phone]

## 2018-03-05 NOTE — Progress Notes (Signed)
  Echocardiogram 2D Echocardiogram has been performed.  Samantha SavoyCasey N Collen [name] 03/05/2018, 8:40 AM

## 2018-03-05 NOTE — Care Management Note (Signed)
Case Management Note  Patient Details  Name: Ron Ageeula E Candelaria MRN: [id number] Date of Birth: 09/01/1927  Subjective/Objective:                    Action/Plan: Received phone call from [place] CSW that patients daughter has gotten her into an ALF. E[facility]Jenna [facility] and they will arrange the RN and therapies. No further needs per CM.   Expected Discharge Date:  03/05/18               Expected Discharge Plan:  Skilled Nursing Facility  In-House Referral:  Clinical Social Work  Discharge planning Services  CM Consult  Post Acute Care Choice:    Choice offered to:     DME Arranged:    DME Agency:     HH Arranged:  PT, OT, RN(through the ALF) HH Agency:     Status of Service:  Completed, signed off  If discussed at [employer] Length of Stay Meetings, dates discussed:    Additional Comments:  Kermit BaloKelli F Jhon Mallozzi, RN 03/05/2018, 12:13 PM

## 2018-03-05 NOTE — Progress Notes (Signed)
PPD test left forearm  03/05/18 @ 1400

## 2018-03-05 NOTE — Progress Notes (Signed)
Patient dtr called RN and informed they have decided to get pt into ALF- feel strongly they can't care for pt at home.  CSW discussed with pt dtr- [facility] in [place] has a room for patient today just needs fl2 and TB test placed with note stating where TB test was put and time it was placed  CSW completed fl2 and sent to [facility] so they can order PT/OT at facility and medications  Pt will be cleared to go once TB test is in.  Burna SisJenna H. Tajuan Dufault, LCSW Clinical Social Worker [phone]

## 2018-03-05 NOTE — Telephone Encounter (Signed)
Eliquis 5 mg #3 Lot [id number] exp 7/21 at front desk for pick up with patient assistance papers

## 2018-03-05 NOTE — Progress Notes (Signed)
Physical Therapy Treatment Patient Details Name: Samantha [name] MRN: [id number] DOB: 24-May-1927 Today's Date: 03/05/2018    History of Present Illness Pt is an 84 y/o female admitted 03/03/18 secondary to increased SOB. Chest imaging showed increased interstitial edema with small pleural effusions. PMH includes blindness, a fib, HTN, CHF.    PT Comments    RN reports pt has bradycardia and soft BP this morning. Ambulated to/from bathroom with RW and close min guard for balance; pt able to perform in-room ADLs with min guard. Reliant on UE support to maintain balance. BP 88/64, HR 85, SpO2 96% on RA; pt asymptomatic throughout session.   Follow Up Recommendations  SNF;Supervision/Assistance - 24 hour     Equipment Recommendations  None recommended by PT    Recommendations for Other Services       Precautions / Restrictions Precautions Precautions: Fall Precaution Comments: Blindness Restrictions Weight Bearing Restrictions: No    Mobility  Bed Mobility Overal bed mobility: Needs Assistance Bed Mobility: Supine to Sit     Supine to sit: Supervision     General bed mobility comments: Supervision for safety. Increased time to perform.   Transfers Overall transfer level: Needs assistance Equipment used: Rolling walker (2 wheeled) Transfers: Sit to/from Stand Sit to Stand: Min assist;Min guard         General transfer comment: Initial minA for balance standing from bed; min guard standing from toilet.  Ambulation/Gait Ambulation/Gait assistance: Min guard Ambulation Distance (Feet): 30 Feet Assistive device: Rolling walker (2 wheeled) Gait Pattern/deviations: Step-through pattern;Decreased stride length;Trunk flexed Gait velocity: Decreased   General Gait Details: Slow, slightly unsteady amb with RW and close min guard for balance. Intermittent cues for navigating the room secondary to pt's vision impairment. Pt with low BP, but reports asymptomatic; kept ambulation  in-room due to this   Stairs             Wheelchair Mobility    Modified Rankin (Stroke Patients Only)       Balance Overall balance assessment: Needs assistance Sitting-balance support: No upper extremity supported;Feet supported Sitting balance-Leahy Scale: Good Sitting balance - Comments: Indep to don shoes sitting EOB   Standing balance support: Bilateral upper extremity supported;During functional activity Standing balance-Leahy Scale: Poor Standing balance comment: Reliant on BUE support                             Cognition Arousal/Alertness: Awake/alert Behavior During Therapy: WFL for tasks assessed/performed Overall Cognitive Status: Within Functional Limits for tasks assessed                                        Exercises      General Comments        Pertinent Vitals/Pain Pain Assessment: No/denies pain    Home Living                      Prior Function            PT Goals (current goals can now be found in the care plan section) Acute Rehab PT Goals Patient Stated Goal: to get stronger before going home  PT Goal Formulation: With patient Time For Goal Achievement: 03/18/18 Potential to Achieve Goals: Good Progress towards PT goals: Progressing toward goals    Frequency    Min 2X/week  PT Plan Current plan remains appropriate    Co-evaluation              AM-PAC PT "6 Clicks" Daily Activity  Outcome Measure  Difficulty turning over in bed (including adjusting bedclothes, sheets and blankets)?: A Little Difficulty moving from lying on back to sitting on the side of the bed? : A Little Difficulty sitting down on and standing up from a chair with arms (e.g., wheelchair, bedside commode, etc,.)?: Unable Help needed moving to and from a bed to chair (including a wheelchair)?: A Little Help needed walking in hospital room?: A Little Help needed climbing 3-5 steps with a railing? : A  Lot 6 Click Score: 15    End of Session Equipment Utilized During Treatment: Gait belt Activity Tolerance: Patient tolerated treatment well;Patient limited by fatigue Patient left: in chair;with call bell/phone within reach Nurse Communication: Mobility status PT Visit Diagnosis: Unsteadiness on feet (R26.81);Muscle weakness (generalized) (M62.81)     Time: [id number] PT Time Calculation (min) (ACUTE ONLY): 22 min  Charges:  $Therapeutic Activity: 8-22 mins                    G Codes:      Ina HomesJaclyn Perlie Stene, PT, DPT Acute Rehab Services  Pager: [phone]  Malachy ChamberJaclyn L Dajaun Goldring 03/05/2018, 1:43 PM

## 2018-03-05 NOTE — Telephone Encounter (Signed)
Aware ready for pick up

## 2018-03-05 NOTE — Telephone Encounter (Signed)
New Message:      Pt c/o medication issue:  1. Name of Medication: apixaban (ELIQUIS) tablet 5 mg  2. How are you currently taking this medication (dosage and times per day)? Take 1 tablet (5 mg total) by mouth 2 (two) times daily.  3. Are you having a reaction (difficulty breathing--STAT)? No  4. What is your medication issue? Pt's daughter is calling due to her mother receiving samples of this medication and pt has specified she needs asst with this medication and [facility]. Pt's daughter states her mother more samples of this medication until the financial asst is provided.

## 2018-03-05 NOTE — Progress Notes (Signed)
CSW met with pt and spoke with pt dtr over the phone about PT recommendation for SNF.  Patient only has 2 inpatient nights at this time- would need 3 to qualify for SNF through medicare guidelines- does not have previous qualifying stay in past 30 days.  CSW explained medicare rules to pt and pt dtr and explained that they could go to SNF private pay or return home with home services  Pt and dtr have decided to DC home with home services- were using [facility] prior to admission and agreeable to continuing using them with hopes they can increase frequency of visits after this hospital stay since pt is weaker than prior to admission  CSW updated RNCM regarding home health needs and plan for DC today  CSW signing off  Jorge Ny, [place] Social Worker [phone]

## 2018-03-05 NOTE — Telephone Encounter (Signed)
Returned call to [place] who states that they already have the updated Rx for the patient on the FL2 from the hospital. No further assistance needed.

## 2018-03-05 NOTE — NC FL2 (Signed)
[place] MEDICAID FL2 LEVEL OF CARE SCREENING TOOL     IDENTIFICATION  Patient Name: Samantha [name] Birthdate: 12-30-1926 Sex: female Admission Date (Current Location): 03/03/2018  [facility] and [place] Number:  [employer] and Address:  The [place]. [facility], [address] [place], [place] [address]      Provider Number: [id number]  Attending Physician Name and Address:  Dimple Nanas, MD  Relative Name and Phone Number:       Current Level of Care: Hospital Recommended Level of Care: Assisted Living Facility Prior Approval Number:    Date Approved/Denied:   PASRR Number:    Discharge Plan: Other (Comment)(ALF)    Current Diagnoses: Patient Active Problem List   Diagnosis Date Noted  . Acute pulmonary edema (HCC)   . Acute respiratory failure (HCC) 03/03/2018  . Acute CHF (HCC)   . Hyperglycemia   . Persistent atrial fibrillation with rapid ventricular response (HCC)   . Atrial fibrillation (HCC) 01/25/2018  . Acute diastolic CHF (congestive heart failure) (HCC) 01/25/2018  . HTN (hypertension) 01/25/2018    Orientation RESPIRATION BLADDER Height & Weight     Self, Time, Situation, Place  Normal Incontinent Weight: 148 lb 5.9 oz (67.3 kg) Height:  5\' 3"  (160 cm)  BEHAVIORAL SYMPTOMS/MOOD NEUROLOGICAL BOWEL NUTRITION STATUS      Continent Diet(regular)  AMBULATORY STATUS COMMUNICATION OF NEEDS Skin   Limited Assist Verbally Normal                       Personal Care Assistance Level of Assistance  Bathing, Dressing Bathing Assistance: Limited assistance   Dressing Assistance: Limited assistance     Functional Limitations Info             SPECIAL CARE FACTORS FREQUENCY  PT (By licensed PT), OT (By licensed OT)     PT Frequency: 3/wk with home health OT Frequency: 3/wk with home health            Contractures      Additional Factors Info  Code Status, Allergies Code Status Info: DNR Allergies Info:  tape             Discharge Medications: TAKE these medications   acetaminophen 500 MG tablet Commonly known as:  TYLENOL Take 500 mg by mouth every 6 (six) hours as needed for moderate pain or headache.   apixaban 5 MG Tabs tablet Commonly known as:  ELIQUIS Take 1 tablet (5 mg total) by mouth 2 (two) times daily.   carvedilol 25 MG tablet Commonly known as:  COREG Take 25 mg by mouth 2 (two) times daily.   cholecalciferol 1000 units tablet Commonly known as:  VITAMIN D Take 1,000 Units by mouth daily.   D-Mannose Powd Take 5 mLs by mouth daily. Mix in 8 oz water and drink   diltiazem 120 MG 24 hr capsule Commonly known as:  CARDIZEM CD Take 1 capsule (120 mg total) by mouth daily.   Fish Oil 1000 MG Caps Take 1,000 mg by mouth daily.   furosemide 40 MG tablet Commonly known as:  LASIX Take 1 tablet (40 mg total) by mouth 2 (two) times daily.   hydrocortisone 2.5 % cream Apply 1 application topically 2 (two) times daily.   hyoscyamine 0.125 MG SL tablet Commonly known as:  LEVSIN SL Take 0.125 mg by mouth every evening.   ICAPS PO Take 1 capsule by mouth 2 (two) times daily.   latanoprost  0.005 % ophthalmic solution Commonly known as:  XALATAN Place 1 drop into both eyes at bedtime.   pantoprazole 40 MG tablet Commonly known as:  PROTONIX Take 40 mg by mouth daily.   potassium chloride 10 MEQ tablet Commonly known as:  K-DUR Take 2 tablets (20 mEq total) by mouth daily.   SYSTANE OP Place 1 drop into both eyes 2 (two) times daily.      Relevant Imaging Results:  Relevant Lab Results:   Additional Information SS#: [id number]  Burna SisUris, Mynor Witkop H, LCSW

## 2018-03-06 DIAGNOSIS — R739 Hyperglycemia, unspecified: Secondary | ICD-10-CM

## 2018-03-06 LAB — BASIC METABOLIC PANEL
Anion gap: 10 (ref 5–15)
BUN: 13 mg/dL (ref 6–20)
CO2: 29 mmol/L (ref 22–32)
CREATININE: 0.92 mg/dL (ref 0.44–1.00)
Calcium: 9.1 mg/dL (ref 8.9–10.3)
Chloride: 100 mmol/L — ABNORMAL LOW (ref 101–111)
GFR calc Af Amer: >60 mL/min (ref >60)
GFR, EST NON AFRICAN AMERICAN: 54 mL/min — AB (ref >60)
Glucose, Bld: 125 mg/dL — ABNORMAL HIGH (ref 65–99)
Potassium: 3.9 mmol/L (ref 3.5–5.1)
SODIUM: 139 mmol/L (ref 135–145)

## 2018-03-06 LAB — MAGNESIUM: MAGNESIUM: 1.6 mg/dL — AB (ref 1.7–2.4)

## 2018-03-06 NOTE — Care Management Important Message (Signed)
Important Message  Patient Details  Name: Samantha [name] MRN: [id number] Date of Birth: May 03, 1927   Medicare Important Message Given:  Yes    [name] [name] 03/06/2018, 2:43 PM

## 2018-03-06 NOTE — Progress Notes (Signed)
Patient will discharge to [facility] Anticipated discharge date: 4/19 Family notified: pt dtr Bonita QuinLinda Transportation by family- plan to arrive around 1:30pm Report #: [phone]  CSW signing off.  Burna SisJenna H. Eliyanah Elgersma, LCSW Clinical Social Worker [phone]

## 2018-03-06 NOTE — Progress Notes (Signed)
Called [facility]aughter and Son in law to transport patient at 1330 today

## 2018-03-06 NOTE — Progress Notes (Addendum)
PROGRESS NOTE    Samantha [name]  ZOX:[id number] DOB: Aug 04, 1927 DOA: 03/03/2018 PCP: Hadley Penobbins, Robert A, MD   Brief Narrative:  84 year old female with history of essential hypertension, paroxysmal atrial fibrillation was recently admitted here in March 2019 for new onset atrial fibrillation with RVR.  At that time she was placed on Cardizem and Eliquis and eventually discharged.  She was scheduled outpatient appointment with cardiology and underwent successful cardioversion on March 02, 2018 with 1 shock with the following day patient acutely became short of breath and presented to the hospital. Patient was admitted for pulmonary edema likely secondary to acute congestive heart failure exacerbationy.  Overnight patient was diuresed with IV Lasix and her symptoms improved.  During her stay patient reported of increase in weakness and deconditioning therefore was evaluated by physical therapy recommended patient would benefit from skilled nursing facility.  During her stay she went into a brief episode of atrial fibrillation with RVR but then her heart rate returned to baseline and remained normal.   Assessment & Plan:   Principal Problem:   Acute respiratory failure (HCC) Active Problems:   Atrial fibrillation (HCC)   HTN (hypertension)   Acute CHF (HCC)   Hyperglycemia   Acute pulmonary edema (HCC)   Acute respiratory distress likely from fluid overload, improved Atrial fibrillation status post cardioversion on March 02, 2018 -During patient stay she was diuresed with IV Lasix and was total -2.4 L.  Her breathing symptoms improved greatly and was weaned off oxygen.  Her IV Lasix was transitioned to oral Lasix 40 mg twice daily with recommendations of the cardiologist.  Potassium chloride supplement has been added and she needs to follow-up outpatient -We will continue her home regimen of Coreg  Paroxysmal atrial fibrillation - At time patient remains on Cardizem, Coreg and  Eliquis.  Essential hypertension -Continue home medications  Generalized weakness and deconditioning -Physical therapy and occupational therapy-recommended skilled nursing facility   DVT prophylaxis: Eliquis Code Status: DO NOT RESUSCITATE Family Communication: None at bedside Disposition Plan: SNF today  Consultants:   Cardiology  Procedures:   None inpatient  Antimicrobials:   None   Subjective: Had an episode of A. fib with RVR yesterday afternoon but later in the evening it resolved and has remained stable since. No complaints, no acute events overnight.   Review of Systems Otherwise negative except as per HPI, including: General: Denies fever, chills, night sweats or unintended weight loss. Resp: Denies cough, wheezing, shortness of breath. Cardiac: Denies chest pain, palpitations, orthopnea, paroxysmal nocturnal dyspnea. GI: Denies abdominal pain, nausea, vomiting, constipation GU: Denies dysuria, frequency, hesitancy or incontinence MS: Denies muscle aches, joint pain or swelling Neuro: Denies headache, neurologic deficits (focal weakness, numbness, tingling), abnormal gait Psych: Denies anxiety, depression, SI/HI/AVH Skin: Denies new rashes or lesions ID: Denies sick contacts, exotic exposures, travel  Objective: Vitals:   03/06/18 0300 03/06/18 0557 03/06/18 0817 03/06/18 1019  BP:  95/65 (!) 123/59 103/68  Pulse:  60 69 67  Resp:   20   Temp:   97.9 F (36.6 C)   TempSrc:   Oral   SpO2:   94%   Weight: 69.8 kg (153 lb 14.1 oz)     Height:        Intake/Output Summary (Last 24 hours) at 03/06/2018 1055 Last data filed at 03/06/2018 1029 Gross per 24 hour  Intake 246 ml  Output 2400 ml  Net -2154 ml   Filed Weights   03/04/18 0350 03/05/18 0500 03/06/18  0300  Weight: 68.7 kg (151 lb 7.3 oz) 67.3 kg (148 lb 5.9 oz) 69.8 kg (153 lb 14.1 oz)    Examination:  General exam: Appears calm and comfortable, elderly frail-appearing Respiratory  system: Some diminished breath sounds at the bases but improved Cardiovascular system: S1 & S2 heard, RRR. No JVD, murmurs, rubs, gallops or clicks. No pedal edema. Gastrointestinal system: Abdomen is nondistended, soft and nontender. No organomegaly or masses felt. Normal bowel sounds heard. Central nervous system: Alert and oriented. No focal neurological deficits. Extremities: Symmetric 5 x 5 power. Skin: No rashes, lesions or ulcers Psychiatry: Judgement and insight appear normal. Mood & affect appropriate.     Data Reviewed:   CBC: Recent Labs  Lab 03/03/18 0330 03/04/18 0257  WBC 10.0 8.0  NEUTROABS  --  4.7  HGB 12.5 11.6*  HCT 37.7 35.8*  MCV 91.3 91.8  PLT 157 127*   Basic Metabolic Panel: Recent Labs  Lab 03/03/18 0330 03/04/18 0257 03/05/18 0211 03/06/18 0317  NA 138 139 139 139  K 4.7 2.8* 4.3 3.9  CL 106 105 103 100*  CO2 23 27 28 29   GLUCOSE 141* 102* 118* 125*  BUN 12 11 14 13   CREATININE 0.76 0.72 0.99 0.92  CALCIUM 8.7* 8.9 9.0 9.1  MG  --   --  1.9 1.6*   GFR: Estimated Creatinine Clearance: 39.6 mL/min (by C-G formula based on SCr of 0.92 mg/dL). Liver Function Tests: No results for input(s): AST, ALT, ALKPHOS, BILITOT, PROT, ALBUMIN in the last 168 hours. No results for input(s): LIPASE, AMYLASE in the last 168 hours. No results for input(s): AMMONIA in the last 168 hours. Coagulation Profile: No results for input(s): INR, PROTIME in the last 168 hours. Cardiac Enzymes: No results for input(s): CKTOTAL, CKMB, CKMBINDEX, TROPONINI in the last 168 hours. BNP (last 3 results) No results for input(s): PROBNP in the last 8760 hours. HbA1C: No results for input(s): HGBA1C in the last 72 hours. CBG: No results for input(s): GLUCAP in the last 168 hours. Lipid Profile: No results for input(s): CHOL, HDL, LDLCALC, TRIG, CHOLHDL, LDLDIRECT in the last 72 hours. Thyroid Function Tests: No results for input(s): TSH, T4TOTAL, FREET4, T3FREE,  THYROIDAB in the last 72 hours. Anemia Panel: No results for input(s): VITAMINB12, FOLATE, FERRITIN, TIBC, IRON, RETICCTPCT in the last 72 hours. Sepsis Labs: No results for input(s): PROCALCITON, LATICACIDVEN in the last 168 hours.  No results found for this or any previous visit (from the past 240 hour(s)).       Radiology Studies: No results found.      Scheduled Meds: . apixaban  5 mg Oral BID  . carvedilol  25 mg Oral BID WC  . diltiazem  120 mg Oral Daily  . furosemide  40 mg Oral BID  . hyoscyamine  0.125 mg Oral QPM  . latanoprost  1 drop Both Eyes QHS  . pantoprazole  40 mg Oral Daily  . sodium chloride flush  3 mL Intravenous Q12H  . tuberculin  5 Units Intradermal Once   Continuous Infusions: . sodium chloride       LOS: 3 days    Time spent: 15 mins    Keajah Killough Joline Maxcy, MD [facility] Pager [phone]   If 7PM-7AM, please contact night-coverage [email or web address] Password [facility] 03/06/2018, 10:55 AM

## 2018-03-06 NOTE — Clinical Social Work Placement (Signed)
   CLINICAL SOCIAL WORK PLACEMENT  NOTE  Date:  03/06/2018  Patient Details  Name: Samantha [name] MRN: [id number] Date of Birth: 06-04-1927  Clinical Social Work is seeking post-discharge placement for this patient at the Skilled  Nursing Facility level of care (*CSW will initial, date and re-position this form in  chart as items are completed):  Yes   Patient/family provided with [facility] Clinical Social Work Department's list of facilities offering this level of care within the geographic area requested by the patient (or if unable, by the patient's family).  Yes   Patient/family informed of their freedom to choose among providers that offer the needed level of care, that participate in Medicare, Medicaid or managed care program needed by the patient, have an available bed and are willing to accept the patient.  Yes   Patient/family informed of [facility]'s ownership interest in [facility] and [facility], as well as of the fact that they are under no obligation to receive care at these facilities.  PASRR submitted to EDS on 03/06/18     PASRR number received on 03/06/18     Existing PASRR number confirmed on       FL2 transmitted to all facilities in geographic area requested by pt/family on 03/06/18     FL2 transmitted to all facilities within larger geographic area on       Patient informed that his/her managed care company has contracts with or will negotiate with certain facilities, including the following:        Yes   Patient/family informed of bed offers received.  Patient chooses bed at [facility]     Physician recommends and patient chooses bed at      Patient to be transferred to [facility] [place] on 03/06/18.  Patient to be transferred to facility by family     Patient family notified on 03/06/18 of transfer.  Name of family member notified:  Samantha [name]     PHYSICIAN Please sign FL2, Please sign DNR     Additional Comment:     _______________________________________________ Burna SisUris, Corinda Ammon H, LCSW 03/06/2018, 12:58 PM

## 2018-03-06 NOTE — NC FL2 (Signed)
[place] MEDICAID FL2 LEVEL OF CARE SCREENING TOOL     IDENTIFICATION  Patient Name: Samantha [name] Birthdate: July 17, 1927 Sex: female Admission Date (Current Location): 03/03/2018  [facility] and [place] Number:  [employer] and Address:  The [place]. [facility], [address], [place], [place] [address]      Provider Number: [id number]  Attending Physician Name and Address:  Dimple Nanas, MD  Relative Name and Phone Number:       Current Level of Care: Hospital Recommended Level of Care: Skilled Nursing Facility Prior Approval Number:    Date Approved/Denied:   PASRR Number: [id number]  Discharge Plan: SNF    Current Diagnoses: Patient Active Problem List   Diagnosis Date Noted  . Acute pulmonary edema (HCC)   . Acute respiratory failure (HCC) 03/03/2018  . Acute CHF (HCC)   . Hyperglycemia   . Persistent atrial fibrillation with rapid ventricular response (HCC)   . Atrial fibrillation (HCC) 01/25/2018  . Acute diastolic CHF (congestive heart failure) (HCC) 01/25/2018  . HTN (hypertension) 01/25/2018    Orientation RESPIRATION BLADDER Height & Weight     Self, Time, Situation, Place  Normal Incontinent Weight: 153 lb 14.1 oz (69.8 kg) Height:  5\' 3"  (160 cm)  BEHAVIORAL SYMPTOMS/MOOD NEUROLOGICAL BOWEL NUTRITION STATUS      Continent Diet(regular)  AMBULATORY STATUS COMMUNICATION OF NEEDS Skin   Limited Assist Verbally Normal                       Personal Care Assistance Level of Assistance  Bathing, Dressing Bathing Assistance: Limited assistance   Dressing Assistance: Limited assistance     Functional Limitations Info  Sight Sight Info: Impaired        SPECIAL CARE FACTORS FREQUENCY  PT (By licensed PT), OT (By licensed OT)     PT Frequency: 5/wk OT Frequency: 5/wk            Contractures      Additional Factors Info  Code Status, Allergies Code Status Info: DNR Allergies Info: tape            Current Medications (03/06/2018):  This is the current hospital active medication list Current Facility-Administered Medications  Medication Dose Route Frequency Provider Last Rate Last Dose  . 0.9 %  sodium chloride infusion  250 mL Intravenous PRN Gwenyth Bender, NP      . acetaminophen (TYLENOL) tablet 650 mg  650 mg Oral Q4H PRN Gwenyth Bender, NP   650 mg at 03/05/18 2054  . apixaban (ELIQUIS) tablet 5 mg  5 mg Oral BID Gwenyth Bender, NP   5 mg at 03/05/18 2253  . carvedilol (COREG) tablet 25 mg  25 mg Oral BID WC Dimple Nanas, MD   Stopped at 03/05/18 1843  . diltiazem (CARDIZEM CD) 24 hr capsule 120 mg  120 mg Oral Daily Black, Karen M, NP   120 mg at 03/05/18 1349  . furosemide (LASIX) tablet 40 mg  40 mg Oral BID Rollene Rotunda, MD   40 mg at 03/05/18 1824  . hyoscyamine (LEVSIN SL) SL tablet 0.125 mg  0.125 mg Oral QPM Gwenyth Bender, NP   0.125 mg at 03/05/18 1824  . latanoprost (XALATAN) 0.005 % ophthalmic solution 1 drop  1 drop Both Eyes QHS Gwenyth Bender, NP   1 drop at 03/05/18 2253  . ondansetron (ZOFRAN) injection 4 mg  4 mg Intravenous Q6H PRN Black,  Lesle ChrisKaren M, NP   4 mg at 03/04/18 0935  . pantoprazole (PROTONIX) EC tablet 40 mg  40 mg Oral Daily Gwenyth BenderBlack, Karen M, NP   40 mg at 03/05/18 1006  . sodium chloride flush (NS) 0.9 % injection 3 mL  3 mL Intravenous Q12H Gwenyth BenderBlack, Karen M, NP   3 mL at 03/05/18 2255  . sodium chloride flush (NS) 0.9 % injection 3 mL  3 mL Intravenous PRN Black, Lesle ChrisKaren M, NP      . tuberculin injection 5 Units  5 Units Intradermal Once Dimple NanasAmin, Ankit Chirag, MD   5 Units at 03/05/18 1332     Discharge Medications: Please see discharge summary for a list of discharge medications.  Relevant Imaging Results:  Relevant Lab Results:   Additional Information SS#: [id number]  Burna SisUris, Abeeha Twist H, LCSW

## 2018-04-07 ENCOUNTER — Ambulatory Visit: Payer: Medicare Other | Admitting: Adult Health

## 2018-04-09 ENCOUNTER — Encounter: Payer: Self-pay | Admitting: Adult Health

## 2018-04-09 ENCOUNTER — Ambulatory Visit (INDEPENDENT_AMBULATORY_CARE_PROVIDER_SITE_OTHER): Payer: Medicare Other | Admitting: Adult Health

## 2018-04-09 VITALS — BP 112/68 | HR 92 | Ht 63.0 in | Wt 157.8 lb

## 2018-04-09 DIAGNOSIS — I4891 Unspecified atrial fibrillation: Secondary | ICD-10-CM | POA: Diagnosis not present

## 2018-04-09 DIAGNOSIS — I5022 Chronic systolic (congestive) heart failure: Secondary | ICD-10-CM | POA: Diagnosis not present

## 2018-04-09 NOTE — Patient Instructions (Signed)
Medication Instructions:  NO CHANGES- Your physician recommends that you continue on your current medications as directed. Please refer to the Current Medication list given to you today.  If you need a refill on your cardiac medications before your next appointment, please call your pharmacy.  Follow-Up: Your physician wants you to follow-up in: 3 months with Dr Antoine Poche.    Thank you for choosing [facility]!!

## 2018-04-09 NOTE — Progress Notes (Signed)
Cardiology Office Note   Date:  04/09/2018   ID:  DUDLEY COOLEY, DOB 13-Jun-1927, MRN [id number]  PCP:  Hadley Pen, MD  Cardiologist:  Dr. Antoine Poche  Chief Complaint  Patient presents with  . Follow-up  . Congestive Heart Failure  . Atrial Fibrillation     History of Present Illness: CALLEN ZUBA is a 84 y.o. female who presents for post hospitalization follow up after admission for acute CHF, with hx of PAF, hypertension, hyperlipidemia,  and dyspnea requiring BiPAP.  She remained on Eliquis for anticoagulation. She had successful DCCV with 1 shock conversion to NSR on 03/02/2018., but returned to the ER with sudden onset of dyspnea. She was found to be in pulmonary edema and a small pleural effusion.   She was treated with IV lasix. She remained in NSR.   Echo completed during recent hospitalization 03/05/2018 revealed reduced EF of 35%-40% (reduced from normal EF 01/26/2018). She went back into atrial fibrillation on discharge with one episode of RVR the night prior to discharge. She was continued on po lasix, diltiazem, coreg, Eliquis and Lasix 40 mg BID.  The patient is legally blind from macular degeneration as well as glaucoma, she is here accompanied by her son.  The patient is without complaints today.  She has physical therapy in her home which is helped her with deconditioning and increasing her strength.  She is doing well and has begin to graduate from a walker to a cane.  The patient denies any associated orthostatic dizziness, dyspnea on exertion, or rapid heart rhythm.  It is noted however the patient cannot tell if she is in a regular rhythm or not.  She has not been weighing herself daily as she is unable to see the scale and family is getting her a voice activated scale which will notify her of her weight each time.  She is medically compliant.   Past Medical History:  Diagnosis Date  . Acute CHF (HCC)   . Hyperglycemia   . Hypertension   . Kidney stones, calcium oxalate    . Persistent atrial fibrillation with rapid ventricular response (HCC) 01/2018    Past Surgical History:  Procedure Laterality Date  . CARDIOVERSION N/A 03/02/2018   Procedure: CARDIOVERSION;  Surgeon: Wendall Stade, MD;  Location: [facility] ENDOSCOPY;  Service: Cardiovascular;  Laterality: N/A;     Current Outpatient Medications  Medication Sig Dispense Refill  . acetaminophen (TYLENOL) 500 MG tablet Take 500 mg by mouth every 6 (six) hours as needed for moderate pain or headache.     Marland Kitchen apixaban (ELIQUIS) 5 MG TABS tablet Take 1 tablet (5 mg total) by mouth 2 (two) times daily. 180 tablet 3  . carvedilol (COREG) 25 MG tablet Take 25 mg by mouth 2 (two) times daily.  0  . D-Mannose POWD Take 5 mLs by mouth daily. Mix in 8 oz water and drink    . diltiazem (CARDIZEM CD) 120 MG 24 hr capsule Take 1 capsule (120 mg total) by mouth daily. 30 capsule 0  . furosemide (LASIX) 20 MG tablet Take 40 mg by mouth 2 (two) times daily.  0  . hydrocortisone 2.5 % cream Apply 1 application topically 2 (two) times daily.     . hyoscyamine (LEVSIN SL) 0.125 MG SL tablet Take 0.125 mg by mouth every evening.   3  . latanoprost (XALATAN) 0.005 % ophthalmic solution Place 1 drop into both eyes at bedtime.  12  . magnesium oxide (MAG-OX) 400  MG tablet Take 1 tablet by mouth 2 (two) times daily.    . Multiple Vitamins-Minerals (ICAPS PO) Take 1 capsule by mouth 2 (two) times daily.    . Omega-3 Fatty Acids (FISH OIL) 1000 MG CAPS Take 1,000 mg by mouth daily.    . pantoprazole (PROTONIX) 40 MG tablet Take 40 mg by mouth daily.  1  . Polyethyl Glycol-Propyl Glycol (SYSTANE OP) Place 1 drop into both eyes 2 (two) times daily.    . potassium chloride (K-DUR) 10 MEQ tablet Take 2 tablets (20 mEq total) by mouth daily. 30 tablet 0   No current facility-administered medications for this visit.     Allergies:   Tape    Social History:  The patient  reports that she has never smoked. She has never used smokeless  tobacco. She reports that she does not drink alcohol or use drugs.   Family History:  The patient's family history includes Heart disease in her father and mother; Heart failure in her daughter.    ROS: All other systems are reviewed and negative. Unless otherwise mentioned in H&P    PHYSICAL EXAM: VS:  BP 112/68   Pulse 92   Ht  (1.6 m)   Wt 157 lb 12.8 oz (71.6 kg)   BMI 27.95 kg/m  , BMI Body mass index is 27.95 kg/m. GEN: Well nourished, well developed, in no acute distress  HEENT: No direct eye contact, eyes are moving but unable to focus on an object. Neck: no JVD, carotid bruits, or masses Cardiac: IRRR; no murmurs, rubs, or gallops,no edema  Respiratory:  clear to auscultation bilaterally, normal work of breathing GI: soft, nontender, nondistended, + BS MS: no deformity or atrophy  Skin: warm and dry, no rash Neuro:  Strength and sensation are intact Psych: euthymic mood, full affect   EKG: Not completed during this office visit  Recent Labs: 01/26/2018: TSH 1.818 03/03/2018: B Natriuretic Peptide 158.2 03/04/2018: Hemoglobin 11.6; Platelets 127 03/06/2018: BUN 13; Creatinine, Ser 0.92; Magnesium 1.6; Potassium 3.9; Sodium 139    Lipid Panel No results found for: CHOL, TRIG, HDL, CHOLHDL, VLDL, LDLCALC, LDLDIRECT    Wt Readings from Last 3 Encounters:  04/09/18 157 lb 12.8 oz (71.6 kg)  03/06/18 153 lb 14.1 oz (69.8 kg)  03/02/18 157 lb (71.2 kg)      Other studies Reviewed: Echocardiogram Mar 17, 2018   Systolic function was moderately reduced. The estimated   ejection fraction was in the range of 35% to 40%. Moderate   diffuse hypokinesis with no identifiable regional variations.   There is marked septal-lateral dyssynchrony. - Ventricular septum: Septal motion showed abnormal function,   dyssynergy, and paradox. These changes are consistent with   intraventricular conduction delay. - Aortic valve: There was trivial regurgitation. - Mitral valve:  There was mild regurgitation. - Left atrium: The atrium was severely dilated. - Right atrium: The atrium was severely dilated. - Atrial septum: No defect or patent foramen ovale was identified. - Tricuspid valve: There was mild-moderate regurgitation directed   centrally. - Pulmonary arteries: Systolic pressure was moderately increased.   PA peak pressure: 54 mm Hg (S).  Impressions:  - Compared to 01/26/2018, LV systolic funtion has deteriorated, in   a global pattern. Intraventricular dyssynchrony is more prominent   (consider rate-related IVCD?).   The rhythm was atrial fibrillation throughout the study.  ASSESSMENT AND PLAN:  1.  Chronic systolic heart failure: She is here posthospitalization and doing well, medically compliant, denies any recurrent  shortness of breath lower extremity edema or chest pressure.  She is having trouble weighing herself daily as she is unable to see the scale.  She is going to get a another scale that will give her her weight verbally.  She is avoiding salt and stating that she still getting good response from diuretic therapy.  She denies any muscle cramping.  We will continue her on current dose.  She will continue on Lasix 40 mg twice a day she is advised to be careful with salt intake.  She verbalizes understanding will need to repeat echocardiogram for reevaluation of LV function in a couple of months.  2.  Paroxysmal atrial fibrillation: The patient had evidence of rapid atrial fibrillation during hospitalization, returned to normal sinus rhythm, prior to discharge has irregular rhythm today.  It is well controlled.  She continues on carvedilol 25 mg twice daily, also diltiazem 120 mg daily.  Would like to wean her from diltiazem in the setting of reduced EF.  She continues on Eliquis 5 mg daily.  She is currently stable.  May need to consider amiodarone loading and repeat cardioversion.  This can be discussed on follow-up with Dr. Antoine Poche.  3.  GERD:  Remains on PPI with magnesium supplement.  4.  Deconditioning: Currently being seen by physical therapy and is doing well, transitioning from walker to cane.  There is a concern for falls in the setting of deconditioning and blindness.  She is counseled on this especially because she is on Eliquis.  She verbalizes understanding.  Current medicines are reviewed at length with the patient today.    Labs/ tests ordered today include:   Bettey Mare. Liborio Nixon, ANP, AACC   04/09/2018 3:19 PM    [facility] [address] [place], [place] [address] Phone: [phone]; Fax: [phone]

## 2018-04-16 ENCOUNTER — Ambulatory Visit: Payer: Medicare Other | Admitting: Adult Health

## 2018-04-29 ENCOUNTER — Telehealth: Payer: Self-pay | Admitting: Cardiology

## 2018-04-29 NOTE — Telephone Encounter (Signed)
New Message    Patient calling the office for samples of medication:   1.  What medication and dosage are you requesting samples for? Eliquis  2.  Are you currently out of this medication? 6 DAYS REMAINING

## 2018-04-29 NOTE — Telephone Encounter (Signed)
Advised daughter no samples at this time. Patient assistance papers printed and left at front desk for pick up

## 2018-05-04 ENCOUNTER — Telehealth: Payer: Self-pay | Admitting: Cardiology

## 2018-05-04 NOTE — Telephone Encounter (Signed)
Returned call to [employer] [facility] Advised no samples are available right now, suggested she call back mid-week. She states patient has enough Eliquis until Wednesday AM. She has Rx but it costs $300

## 2018-05-04 NOTE — Telephone Encounter (Signed)
New Message:       Pt is calling and would like to see if we have any samples for apixaban (ELIQUIS) 5 MG TABS tablet

## 2018-05-06 ENCOUNTER — Telehealth: Payer: Self-pay | Admitting: Cardiology

## 2018-05-06 NOTE — Telephone Encounter (Signed)
New Message ° ° °Patient calling the office for samples of medication: ° ° °1.  What medication and dosage are you requesting samples for? apixaban (ELIQUIS) 5 MG TABS tablet ° °2.  Are you currently out of this medication?  yes ° ° °

## 2018-05-06 NOTE — Telephone Encounter (Signed)
Medication samples have been provided to the patient.  Drug name: Eliquis 5 mg  Qty: 28 tablets  LOT: [id number]  Exp.Date: 06/21  Samples left at front desk for patient pick-up. Left message to make daughter aware.

## 2018-05-15 ENCOUNTER — Telehealth: Payer: Self-pay | Admitting: Cardiology

## 2018-05-15 NOTE — Telephone Encounter (Signed)
New Message    Patient's daughter is calling back about samples for her mother   apixaban (ELIQUIS) 5 MG TABS tablet

## 2018-05-15 NOTE — Telephone Encounter (Signed)
lmtcb Samples provided last week - see telephone encounter from 05/06/18.

## 2018-05-18 ENCOUNTER — Telehealth: Payer: Self-pay | Admitting: Cardiology

## 2018-05-18 NOTE — Telephone Encounter (Signed)
New Message  Pt's daughter is calling to check on the request for assistance form that was email and also states her mother will be running out of Eliquis on 7/3  Patient calling the office for samples of medication:   1.  What medication and dosage are you requesting samples for? apixaban (ELIQUIS) 5 MG TABS tablet  2.  Are you currently out of this medication? Will be Wednesday

## 2018-05-18 NOTE — Telephone Encounter (Signed)
Patient made aware that samples are available at the front.  Medication Samples have been provided to the patient.  Drug name: Eliquis        Strength: 5 mg         Qty: 3 boxes   LOT: [id number]   Exp.Date: 6/21

## 2018-07-12 NOTE — Progress Notes (Signed)
Cardiology Office Note   Date:  07/13/2018   ID:  Samantha [name], Samantha [name] 01/31/1927, MRN [id number]  PCP:  Hadley Pen, MD  Cardiologist:   Rollene Rotunda, MD   Chief Complaint  Patient presents with  . Atrial Fibrillation      History of Present Illness: Samantha [name] is a 84 y.o. female who I saw in the hospital earlier this year for evaluation of atrial fib.   She was treated with Eliquis and had successful DCCV.   However, she went back into atrial fib before discharge.  She was also found to have a reduced EF of 35 - 40%.  It does appear that she was in normal sinus rhythm before discharge.  This was the most recent documentation.  She is in sinus today.  She actually feels well.  She did rehab.  She denies any cardiovascular symptoms.  She gets around slowly with a walker.  She is also somewhat limited by blindness.  She denies however any cardiovascular symptoms such as chest pressure, neck or arm discomfort.  She has not had any new shortness of breath, PND or orthopnea.  She has not noticed any palpitations, presyncope or syncope.   Past Medical History:  Diagnosis Date  . Acute CHF (HCC)   . Hyperglycemia   . Hypertension   . Kidney stones, calcium oxalate   . Persistent atrial fibrillation with rapid ventricular response (HCC) 01/2018    Past Surgical History:  Procedure Laterality Date  . CARDIOVERSION N/A 03/02/2018   Procedure: CARDIOVERSION;  Surgeon: Wendall Stade, MD;  Location: [facility] ENDOSCOPY;  Service: Cardiovascular;  Laterality: N/A;     Current Outpatient Medications  Medication Sig Dispense Refill  . acetaminophen (TYLENOL) 500 MG tablet Take 500 mg by mouth every 6 (six) hours as needed for moderate pain or headache.     Marland Kitchen apixaban (ELIQUIS) 5 MG TABS tablet Take 1 tablet (5 mg total) by mouth 2 (two) times daily. 180 tablet 3  . carvedilol (COREG) 25 MG tablet Take 25 mg by mouth 2 (two) times daily.  0  . D-Mannose POWD Take 5 mLs by mouth daily.  Mix in 8 oz water and drink    . diltiazem (CARDIZEM CD) 120 MG 24 hr capsule Take 1 capsule (120 mg total) by mouth daily. 30 capsule 0  . furosemide (LASIX) 20 MG tablet Take 40 mg by mouth 2 (two) times daily.  0  . hydrocortisone 2.5 % cream Apply 1 application topically 2 (two) times daily.     . hyoscyamine (LEVSIN SL) 0.125 MG SL tablet Take 0.125 mg by mouth every evening.   3  . latanoprost (XALATAN) 0.005 % ophthalmic solution Place 1 drop into both eyes at bedtime.  12  . magnesium oxide (MAG-OX) 400 MG tablet Take 1 tablet by mouth 2 (two) times daily.    . Multiple Vitamins-Minerals (ICAPS PO) Take 1 capsule by mouth 2 (two) times daily.    . Omega-3 Fatty Acids (FISH OIL) 1000 MG CAPS Take 1,000 mg by mouth daily.    . pantoprazole (PROTONIX) 40 MG tablet Take 40 mg by mouth daily.  1  . Polyethyl Glycol-Propyl Glycol (SYSTANE OP) Place 1 drop into both eyes 2 (two) times daily.    . potassium chloride (K-DUR) 10 MEQ tablet Take 2 tablets (20 mEq total) by mouth daily. 30 tablet 0  . losartan (COZAAR) 25 MG tablet Take 1 tablet (25 mg total) by mouth  daily. 90 tablet 3   No current facility-administered medications for this visit.     Allergies:   Tape    ROS:  Please see the history of present illness.   Otherwise, review of systems are positive for none.   All other systems are reviewed and negative.    PHYSICAL EXAM: VS:  BP (!) 142/88   Pulse 68   Ht 5\' 3"  (1.6 m)   Wt 160 lb (72.6 kg)   BMI 28.34 kg/m  , BMI Body mass index is 28.34 kg/m. GENERAL:  Well appearing NECK:  No jugular venous distention, waveform within normal limits, carotid upstroke brisk and symmetric, no bruits, no thyromegaly LUNGS:  Clear to auscultation bilaterally CHEST:  Unremarkable HEART:  PMI not displaced or sustained,S1 and S2 within normal limits, no S3, no S4, no clicks, no rubs, no murmurs ABD:  Flat, positive bowel sounds normal in frequency in pitch, no bruits, no rebound, no  guarding, no midline pulsatile mass, no hepatomegaly, no splenomegaly EXT:  2 plus pulses throughout, no edema, no cyanosis no clubbing   EKG:  EKG is not ordered today.    Recent Labs: 01/26/2018: TSH 1.818 03/03/2018: B Natriuretic Peptide 158.2 03/04/2018: Hemoglobin 11.6; Platelets 127 03/06/2018: BUN 13; Creatinine, Ser 0.92; Magnesium 1.6; Potassium 3.9; Sodium 139    Lipid Panel No results found for: CHOL, TRIG, HDL, CHOLHDL, VLDL, LDLCALC, LDLDIRECT    Wt Readings from Last 3 Encounters:  07/13/18 160 lb (72.6 kg)  04/09/18 157 lb 12.8 oz (71.6 kg)  03/06/18 153 lb 14.1 oz (69.8 kg)      Other studies Reviewed: Additional studies/ records that were reviewed today include: Hospital records. Review of the above records demonstrates:  Please see elsewhere in the note.     ASSESSMENT AND PLAN:  ATRIAL FIB:   She seems to be maintaining sinus rhythm.  Tolerates anticoagulation.   Samantha [name] has a CHA2DS2 - VASc score of 5.  No change in therapy.  CHRONIC SYSTOLIC HF: I am going to try to titrate her meds by adding Cozaar 25 mg daily.  She comes back in a couple of weeks she can get a basic metabolic profile and have this titrated.  Eventually I would like to discontinue the Cardizem as she is maintaining sinus rhythm.  I would not follow with an echo and switch [facility] if her ejection fraction remains low.  HTN:   This will be managed in the context of treating her heart failure.  Current medicines are reviewed at length with the patient today.  The patient does not have concerns regarding medicines.  The following changes have been made:  no change  Labs/ tests ordered today include: None  Orders Placed This Encounter  Procedures  . Basic Metabolic Panel (BMET)     Disposition:   FU with Joni ReiningKathryn Lawrence DNP in two weeks.     Signed, Rollene RotundaJames Cyla Haluska, MD  07/13/2018 12:54 PM    [facility]

## 2018-07-13 ENCOUNTER — Ambulatory Visit (INDEPENDENT_AMBULATORY_CARE_PROVIDER_SITE_OTHER): Payer: Medicare Other | Admitting: Cardiology

## 2018-07-13 ENCOUNTER — Encounter: Payer: Self-pay | Admitting: Cardiology

## 2018-07-13 VITALS — BP 142/88 | HR 68 | Ht 63.0 in | Wt 160.0 lb

## 2018-07-13 DIAGNOSIS — Z79899 Other long term (current) drug therapy: Secondary | ICD-10-CM | POA: Diagnosis not present

## 2018-07-13 DIAGNOSIS — I10 Essential (primary) hypertension: Secondary | ICD-10-CM

## 2018-07-13 DIAGNOSIS — I5022 Chronic systolic (congestive) heart failure: Secondary | ICD-10-CM | POA: Diagnosis not present

## 2018-07-13 DIAGNOSIS — I480 Paroxysmal atrial fibrillation: Secondary | ICD-10-CM | POA: Diagnosis not present

## 2018-07-13 MED ORDER — LOSARTAN POTASSIUM 25 MG PO TABS: 25.0000 mg | ORAL_TABLET | Freq: Every day | ORAL | 3 refills | Status: DC

## 2018-07-13 NOTE — Patient Instructions (Signed)
Medication Instructions:  START- Losartan 25 mg 1 tablet daily  If you need a refill on your cardiac medications before your next appointment, please call your pharmacy.  Labwork: BMP 2 weeks HERE IN OUR OFFICE AT [facility]  Take the provided lab slips with you to the lab for your blood draw.   You will NOT need to fast   Testing/Procedures: None Ordered   Follow-Up: Your physician wants you to follow-up in: 2 weeks with Joni ReiningKathryn Lawrence.     Thank you for choosing [facility]!!

## 2018-07-15 IMAGING — CR DG CHEST 2V
2 series · 2 of 2 positions shown · non-contrast
Comparison: Chest radiograph January 25, 2018

CLINICAL DATA: Shortness of breath tonight, status post
cardioversion March 02, 2018. History of atrial fibrillation,
hypertension.

EXAM:
CHEST - 2 VIEW

[chest lat]
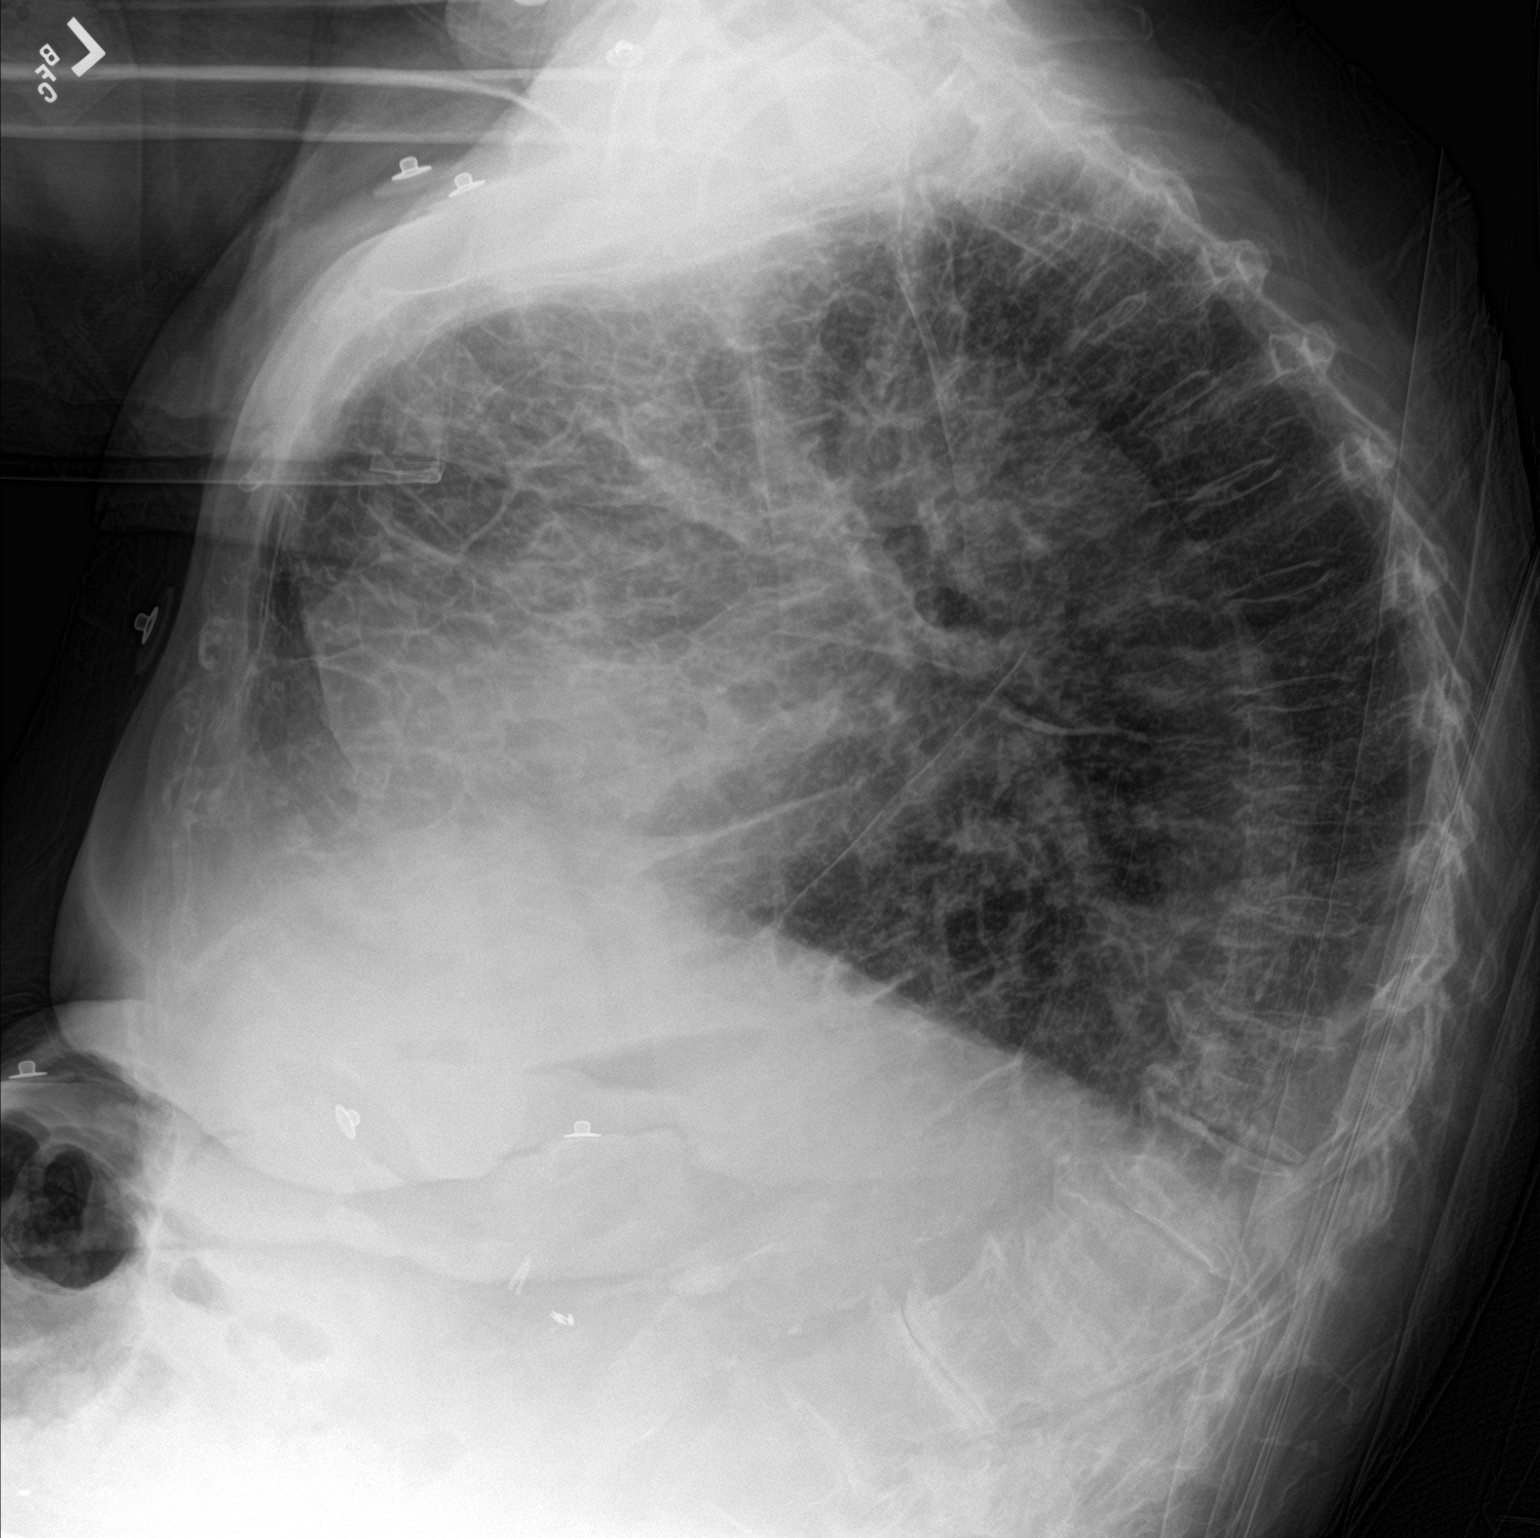

[chest ap]
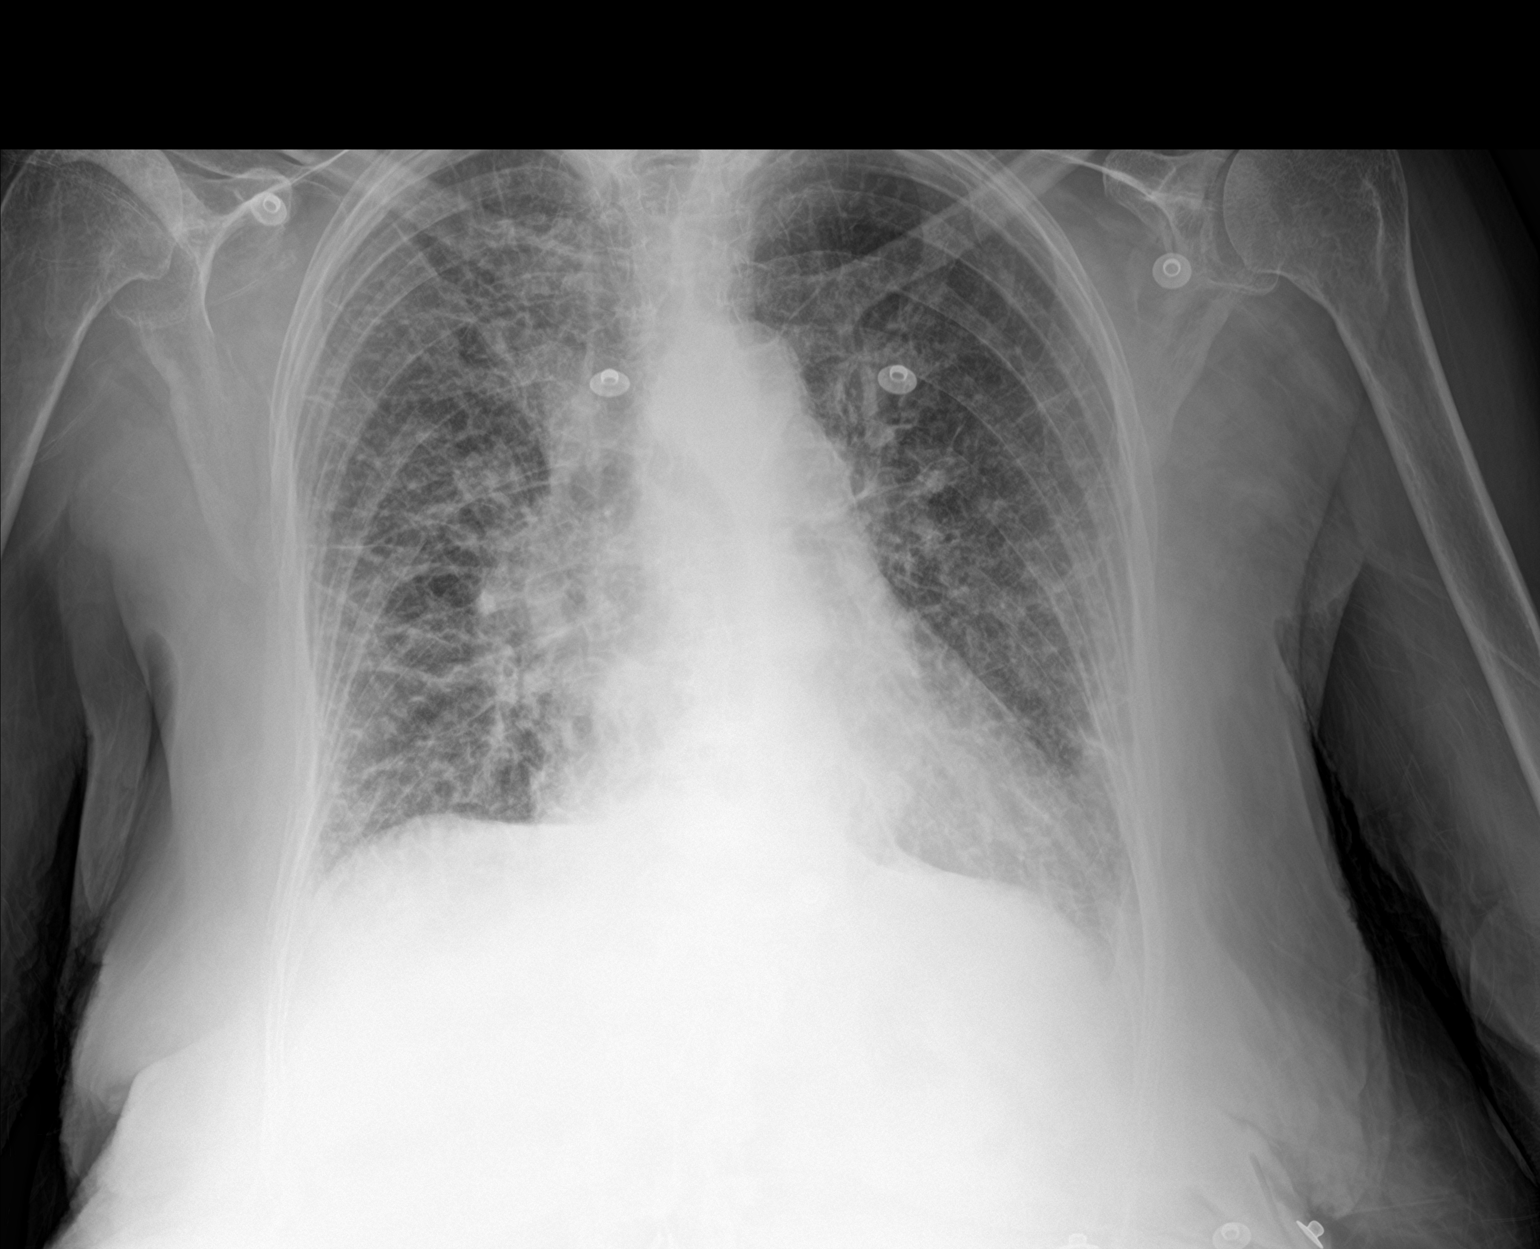

[2 of 2 positions shown; findings below may reference images not displayed]

FINDINGS: Cardiac silhouette is mildly enlarged. Mildly calcified aortic knob.
Increasing interstitial prominence with Kerley B-lines. Trace
posterior pleural effusion. No focal consolidation. No pneumothorax.
Osteopenia. Accentuated thoracic kyphosis with severe degenerative
change of the thoracolumbar junction. Surgical clips in the included
right abdomen compatible with cholecystectomy.
IMPRESSION: Cardiomegaly. Increasing interstitial edema with small pleural
effusion.

## 2018-07-17 ENCOUNTER — Ambulatory Visit: Payer: Medicare Other | Admitting: Cardiology

## 2018-07-29 ENCOUNTER — Encounter: Payer: Self-pay | Admitting: Adult Health

## 2018-07-29 ENCOUNTER — Ambulatory Visit (INDEPENDENT_AMBULATORY_CARE_PROVIDER_SITE_OTHER): Payer: Medicare Other | Admitting: Adult Health

## 2018-07-29 VITALS — BP 138/88 | HR 72 | Ht 62.0 in | Wt 157.8 lb

## 2018-07-29 DIAGNOSIS — I5022 Chronic systolic (congestive) heart failure: Secondary | ICD-10-CM | POA: Diagnosis not present

## 2018-07-29 DIAGNOSIS — R931 Abnormal findings on diagnostic imaging of heart and coronary circulation: Secondary | ICD-10-CM | POA: Diagnosis not present

## 2018-07-29 DIAGNOSIS — I10 Essential (primary) hypertension: Secondary | ICD-10-CM

## 2018-07-29 DIAGNOSIS — I480 Paroxysmal atrial fibrillation: Secondary | ICD-10-CM | POA: Diagnosis not present

## 2018-07-29 MED ORDER — LOSARTAN POTASSIUM 25 MG PO TABS: 25.0000 mg | ORAL_TABLET | Freq: Every day | ORAL | 3 refills | Status: AC

## 2018-07-29 NOTE — Progress Notes (Signed)
Cardiology Office Note   Date:  07/29/2018   ID:  Samantha [name], DOB Jan 22, 1927, MRN [id number]  PCP:  Hadley Pen, MD  Cardiologist:  [facility]  Chief Complaint  Patient presents with  . Atrial Fibrillation  . Congestive Heart Failure     History of Present Illness: Samantha [name] is a 84 y.o. female who presents for ongoing assessment and management of atrial fib She has had a hx of prior DCCV but, reverted back to atrial fib. She is on Eliquis for anticoagulation with CHADS VASC Score of 5.  Most recent echo determined EF of 35%-40%. Other history includes hypertension, hyperglycemia, and chronic systolic CHF.    On last office visit 07/13/2018, medications were adjusted by adding Cozaar 25 mg daily. Plan was to discontinue diltiazem, have follow up BMET and Echo if she was in NSR and switch to [place].  She comes today without complaints. She states that she can tell when she is in atrial fib, as her breathing status changes for the worse. She has not experienced this since seen last. She is tolerating Cozaar without issues of dizziness. She is careful with her medications as she has blindness, they are provided to her in a pill pack, and she counts them by feeling them each time she takes them.  Past Medical History:  Diagnosis Date  . Acute CHF (HCC)   . Hyperglycemia   . Hypertension   . Kidney stones, calcium oxalate   . Persistent atrial fibrillation with rapid ventricular response (HCC) 01/2018    Past Surgical History:  Procedure Laterality Date  . CARDIOVERSION N/A 03/02/2018   Procedure: CARDIOVERSION;  Surgeon: Wendall Stade, MD;  Location: [facility] ENDOSCOPY;  Service: Cardiovascular;  Laterality: N/A;     Current Outpatient Medications  Medication Sig Dispense Refill  . acetaminophen (TYLENOL) 500 MG tablet Take 500 mg by mouth every 6 (six) hours as needed for moderate pain or headache.     Marland Kitchen apixaban (ELIQUIS) 5 MG TABS tablet Take 1 tablet (5 mg total) by mouth  2 (two) times daily. 180 tablet 3  . carvedilol (COREG) 25 MG tablet Take 25 mg by mouth 2 (two) times daily.  0  . D-Mannose POWD Take 5 mLs by mouth daily. Mix in 8 oz water and drink    . diltiazem (CARDIZEM CD) 120 MG 24 hr capsule Take 1 capsule (120 mg total) by mouth daily. 30 capsule 0  . furosemide (LASIX) 20 MG tablet Take 40 mg by mouth 2 (two) times daily.  0  . hydrocortisone 2.5 % cream Apply 1 application topically 2 (two) times daily.     . hyoscyamine (LEVSIN SL) 0.125 MG SL tablet Take 0.125 mg by mouth every evening.   3  . latanoprost (XALATAN) 0.005 % ophthalmic solution Place 1 drop into both eyes at bedtime.  12  . losartan (COZAAR) 25 MG tablet Take 1 tablet (25 mg total) by mouth daily. 90 tablet 3  . magnesium oxide (MAG-OX) 400 MG tablet Take 1 tablet by mouth 2 (two) times daily.    . Multiple Vitamins-Minerals (ICAPS PO) Take 1 capsule by mouth 2 (two) times daily.    . Omega-3 Fatty Acids (FISH OIL) 1000 MG CAPS Take 1,000 mg by mouth daily.    . pantoprazole (PROTONIX) 40 MG tablet Take 40 mg by mouth daily.  1  . Polyethyl Glycol-Propyl Glycol (SYSTANE OP) Place 1 drop into both eyes 2 (two) times daily.    Marland Kitchen  potassium chloride (K-DUR) 10 MEQ tablet Take 2 tablets (20 mEq total) by mouth daily. 30 tablet 0   No current facility-administered medications for this visit.     Allergies:   Tape    Social History:  The patient  reports that she has never smoked. She has never used smokeless tobacco. She reports that she does not drink alcohol or use drugs.   Family History:  The patient's family history includes Heart disease in her father and mother; Heart failure in her daughter.    ROS: All other systems are reviewed and negative. Unless otherwise mentioned in H&P    PHYSICAL EXAM: VS:  BP 138/88   Pulse 72   Ht 5\' 2"  (1.575 m)   Wt 157 lb 12.8 oz (71.6 kg)   LMP  (LMP Unknown)   BMI 28.86 kg/m  , BMI Body mass index is 28.86 kg/m. GEN: Well  nourished, well developed, in no acute distress  HEENT: normal  Neck: no JVD, carotid bruits, or masses Cardiac: RRR; no murmurs, rubs, or gallops,no edema  Respiratory:  clear to auscultation bilaterally, normal work of breathing GI: soft, nontender, nondistended, + BS MS: no deformity or atrophy  Skin: warm and dry, no rash. Pale Neuro:  Strength and sensation are intact Psych: euthymic mood, full affect   EKG: NSR rate of 73 bpm. T-wave flattening in the lateral leads.   Recent Labs: 01/26/2018: TSH 1.818 03/03/2018: B Natriuretic Peptide 158.2 03/04/2018: Hemoglobin 11.6; Platelets 127 03/06/2018: BUN 13; Creatinine, Ser 0.92; Magnesium 1.6; Potassium 3.9; Sodium 139    Lipid Panel No results found for: CHOL, TRIG, HDL, CHOLHDL, VLDL, LDLCALC, LDLDIRECT    Wt Readings from Last 3 Encounters:  07/29/18 157 lb 12.8 oz (71.6 kg)  07/13/18 160 lb (72.6 kg)  04/09/18 157 lb 12.8 oz (71.6 kg)    Other studies Reviewed: Echocardiogram Mar 31, 2018  Left ventricle: The cavity size was normal. Wall thickness was   normal. Systolic function was moderately reduced. The estimated   ejection fraction was in the range of 35% to 40%. Moderate   diffuse hypokinesis with no identifiable regional variations.   There is marked septal-lateral dyssynchrony. - Ventricular septum: Septal motion showed abnormal function,   dyssynergy, and paradox. These changes are consistent with   intraventricular conduction delay. - Aortic valve: There was trivial regurgitation. - Mitral valve: There was mild regurgitation. - Left atrium: The atrium was severely dilated. - Right atrium: The atrium was severely dilated. - Atrial septum: No defect or patent foramen ovale was identified. - Tricuspid valve: There was mild-moderate regurgitation directed   centrally. - Pulmonary arteries: Systolic pressure was moderately increased.   PA peak pressure: 54 mm Hg (S).  ASSESSMENT AND PLAN:  1.  PAF: EKG today  reveals NSR rate of 73 bpm with mild LVH. T-wave flattening is noted laterally. Continue current regimen for now with rate control and anticoagulation. She is on diltiazem and apixaban.   2. HFrEF: Most recent echo revealed reduced EF of 35%-40%. Repeating echo for evaluation of EF. Plan to stop cozaar and start Entresto if continues to have reduced EF. Continue coreg 25 mg BID.   3.Hypertension: BP is well controlled currently. Rechecking echo for LV fx BMET ordered today.    Current medicines are reviewed at length with the patient today.    Labs/ tests ordered today include: BMET and echocardiogram   Bettey Mare. Liborio Nixon, ANP, AACC   07/29/2018 4:40 PM    [facility] [address] [place], [place] [address] Phone: [phone]; Fax: [phone]

## 2018-07-29 NOTE — Patient Instructions (Addendum)
Medication Instructions:  NO CHANGES- Your physician recommends that you continue on your current medications as directed. Please refer to the Current Medication list given to you today.  If you need a refill on your cardiac medications before your next appointment, please call your pharmacy.  Labwork: BMET TODAY HERE IN OUR OFFICE AT [facility]  Take the provided lab slips with you to the lab for your blood draw.   Testing/Procedures: Echocardiogram - Your physician has requested that you have an echocardiogram. Echocardiography is a painless test that uses sound waves to create images of your heart. It provides your doctor with information about the size and shape of your heart and how well your heart's chambers and valves are working. This procedure takes approximately one hour. There are no restrictions for this procedure. This will be performed at our [facility] location - [address], [address].  Follow-Up: Your physician wants you to follow-up in: 1 MONTH WITH DR [facility] -OR- KATHRYN LAWRENCE (NURSE PRACTIONIER), DNP,AACC IF PRIMARY CARDIOLOGIST IS UNAVAILABLE.   Thank you for choosing [facility] at [facility]!!

## 2018-07-30 LAB — BASIC METABOLIC PANEL
BUN / CREAT RATIO: 20 (ref 12–28)
BUN: 17 mg/dL (ref 8–27)
CO2: 29 mmol/L (ref 20–29)
CREATININE: 0.85 mg/dL (ref 0.57–1.00)
Calcium: 9.9 mg/dL (ref 8.7–10.3)
Chloride: 102 mmol/L (ref 96–106)
GFR calc non Af Amer: 61 mL/min/{1.73_m2} (ref >59)
GFR, EST AFRICAN AMERICAN: 71 mL/min/{1.73_m2} (ref >59)
GLUCOSE: 99 mg/dL (ref 65–99)
Potassium: 4.3 mmol/L (ref 3.5–5.2)
SODIUM: 145 mmol/L — AB (ref 134–144)

## 2018-08-10 ENCOUNTER — Ambulatory Visit (HOSPITAL_COMMUNITY): Payer: Medicare Other | Attending: Cardiovascular Disease

## 2018-08-10 ENCOUNTER — Other Ambulatory Visit: Payer: Self-pay

## 2018-08-10 DIAGNOSIS — I509 Heart failure, unspecified: Secondary | ICD-10-CM | POA: Insufficient documentation

## 2018-08-10 DIAGNOSIS — I480 Paroxysmal atrial fibrillation: Secondary | ICD-10-CM | POA: Insufficient documentation

## 2018-08-10 DIAGNOSIS — R931 Abnormal findings on diagnostic imaging of heart and coronary circulation: Secondary | ICD-10-CM | POA: Insufficient documentation

## 2018-08-10 DIAGNOSIS — I110 Hypertensive heart disease with heart failure: Secondary | ICD-10-CM | POA: Diagnosis not present

## 2018-08-30 NOTE — Progress Notes (Signed)
Cardiology Office Note   Date:  08/31/2018   ID:  Samantha [name], DOB 25-Apr-1927, MRN [id number]  PCP:  Samantha Pen, MD  Cardiologist: [facility] Chief Complaint  Patient presents with  . Atrial Fibrillation  . Congestive Heart Failure     History of Present Illness: Samantha [name] is a 84 y.o. female who presents for ongoing assessment and management of atrial fibrillation, history of DCCV which was temporarily successful but she reverted back to atrial fibrillation.  She was continued on Eliquis for anticoagulation with a CHADS VASC  score of 5.  Other history includes hypertension, hyperglycemia, chronic systolic heart failure with EF of 35% to 40%.  She was last seen in the office on 07/29/2018, had tolerated medication management without issues of dizziness.  She is legally blind and therefore careful monitoring of her medication adherence was strongly recommended to her family members.  I repeated her echocardiogram to evaluate her current status, with institution of Entresto if she continued to exihbit  low systolic function.  She was continued on all of her medications at that time.   Echocardiogram dated 08/10/2018 determined that she had some improvement in her EF to 40% to 45%. She says she feels great.   Past Medical History:  Diagnosis Date  . Acute CHF (HCC)   . Hyperglycemia   . Hypertension   . Kidney stones, calcium oxalate   . Persistent atrial fibrillation with rapid ventricular response 01/2018    Past Surgical History:  Procedure Laterality Date  . CARDIOVERSION N/A 03/02/2018   Procedure: CARDIOVERSION;  Surgeon: Wendall Stade, MD;  Location: [facility] ENDOSCOPY;  Service: Cardiovascular;  Laterality: N/A;     Current Outpatient Medications  Medication Sig Dispense Refill  . acetaminophen (TYLENOL) 500 MG tablet Take 500 mg by mouth every 6 (six) hours as needed for moderate pain or headache.     Marland Kitchen apixaban (ELIQUIS) 5 MG TABS tablet Take 1 tablet (5 mg total) by  mouth 2 (two) times daily. 180 tablet 3  . carvedilol (COREG) 25 MG tablet Take 25 mg by mouth 2 (two) times daily.  0  . D-Mannose POWD Take 5 mLs by mouth daily. Mix in 8 oz water and drink    . diltiazem (CARDIZEM CD) 120 MG 24 hr capsule Take 1 capsule (120 mg total) by mouth daily. 30 capsule 0  . furosemide (LASIX) 20 MG tablet Take 40 mg by mouth 2 (two) times daily.  0  . hydrocortisone 2.5 % cream Apply 1 application topically 2 (two) times daily.     . hyoscyamine (LEVSIN SL) 0.125 MG SL tablet Take 0.125 mg by mouth every evening.   3  . latanoprost (XALATAN) 0.005 % ophthalmic solution Place 1 drop into both eyes at bedtime.  12  . losartan (COZAAR) 25 MG tablet Take 1 tablet (25 mg total) by mouth daily. 90 tablet 3  . magnesium oxide (MAG-OX) 400 MG tablet Take 1 tablet by mouth 2 (two) times daily.    . Multiple Vitamins-Minerals (ICAPS PO) Take 1 capsule by mouth 2 (two) times daily.    . Omega-3 Fatty Acids (FISH OIL) 1000 MG CAPS Take 1,000 mg by mouth daily.    . pantoprazole (PROTONIX) 40 MG tablet Take 40 mg by mouth daily.  1  . Polyethyl Glycol-Propyl Glycol (SYSTANE OP) Place 1 drop into both eyes 2 (two) times daily.    . potassium chloride (K-DUR) 10 MEQ tablet Take 2 tablets (20 mEq total)  by mouth daily. 30 tablet 0   No current facility-administered medications for this visit.     Allergies:   Tape    Social History:  The patient  reports that she has never smoked. She has never used smokeless tobacco. She reports that she does not drink alcohol or use drugs.   Family History:  The patient's family history includes Heart disease in her father and mother; Heart failure in her daughter.    ROS: All other systems are reviewed and negative. Unless otherwise mentioned in H&P    PHYSICAL EXAM: VS:  BP 132/76 (BP Location: Right Arm, Patient Position: Sitting, Cuff Size: Normal)   Pulse 76   Ht 5\' 2"  (1.575 m)   Wt 160 lb (72.6 kg)   LMP  (LMP Unknown)   SpO2  97%   BMI 29.26 kg/m  , BMI Body mass index is 29.26 kg/m. GEN: Well nourished, well developed, in no acute distress HEENT: normal Neck: no JVD, carotid bruits, or masses Cardiac: RRR, distant heart sounds,; no murmurs, rubs, or gallops,no edema  Respiratory:  Clear to auscultation bilaterally, normal work of breathing GI: soft, nontender, nondistended, + BS MS: no deformity or atrophy Skin: warm and dry, no rash Neuro:  Strength and sensation are intact Psych: euthymic mood, full affect   EKG:  Not completed today.   Recent Labs: 01/26/2018: TSH 1.818 03/03/2018: B Natriuretic Peptide 158.2 03/04/2018: Hemoglobin 11.6; Platelets 127 03/06/2018: Magnesium 1.6 07/29/2018: BUN 17; Creatinine, Ser 0.85; Potassium 4.3; Sodium 145    Lipid Panel No results found for: CHOL, TRIG, HDL, CHOLHDL, VLDL, LDLCALC, LDLDIRECT    Wt Readings from Last 3 Encounters:  08/31/18 160 lb (72.6 kg)  07/29/18 157 lb 12.8 oz (71.6 kg)  07/13/18 160 lb (72.6 kg)      Other studies Reviewed: Left ventricle: Diffuse hypokinesis worse in the septum. Wall   thickness was increased in a pattern of mild LVH. Systolic   function was mildly to moderately reduced. The estimated ejection   fraction was in the range of 40% to 45%. Left ventricular   diastolic function parameters were normal. - Aortic valve: There was trivial regurgitation. - Left atrium: The atrium was mildly dilated. - Atrial septum: No defect or patent foramen ovale was identified.    ASSESSMENT AND PLAN:  1. Chronic Systolic CHF; Most recent echo revealed improved EF to 45%. She is doing well, is without complaints. Will continue current regimen,does not need Entresto. See again in 6 months.   2. Atrial Fib: Heart rate is well controlled. She denies bleeding or palpitations. Continue current regimen. She is to call if she is having any new symptoms.   3. Legally Blind: Has assistance and also pre-packaged pills for each time of day.  This is working well for her.   Current medicines are reviewed at length with the patient today.    Labs/ tests ordered today include:  Bettey Mare. Liborio Nixon, ANP, AACC   08/31/2018 10:17 AM    [facility] [address] Office [phone] Fax [phone]

## 2018-08-31 ENCOUNTER — Ambulatory Visit (INDEPENDENT_AMBULATORY_CARE_PROVIDER_SITE_OTHER): Payer: Medicare Other | Admitting: Adult Health

## 2018-08-31 ENCOUNTER — Encounter: Payer: Self-pay | Admitting: Adult Health

## 2018-08-31 VITALS — BP 132/76 | HR 76 | Ht 62.0 in | Wt 160.0 lb

## 2018-08-31 DIAGNOSIS — I480 Paroxysmal atrial fibrillation: Secondary | ICD-10-CM | POA: Diagnosis not present

## 2018-08-31 DIAGNOSIS — I5022 Chronic systolic (congestive) heart failure: Secondary | ICD-10-CM

## 2018-08-31 NOTE — Patient Instructions (Signed)
Medication Instructions:  NO CHANGES- Your physician recommends that you continue on your current medications as directed. Please refer to the Current Medication list given to you today.  If you need a refill on your cardiac medications before your next appointment, please call your pharmacy.  Follow-Up: You will need a follow up appointment in 6 MONTHS .  Please call our office 2 months in advance(FEB 2020) to schedule the (April 2020) appointment.  You may see  DR [facility] or one of the following Advanced Practice Providers on your designated Care Team:   . Theodore Demark, PA-C . Joni Reining, DNP, ANP  At [facility], you and your health needs are our priority.  As part of our continuing mission to provide you with exceptional heart care, we have created designated Provider Care Teams.  These Care Teams include your primary Cardiologist (physician) and Advanced Practice Providers (APPs -  Physician Assistants and Nurse Practitioners) who all work together to provide you with the care you need, when you need it.  Labwork: If you have labs (blood work) drawn today and your tests are completely normal, you will receive your results only by: Marland Kitchen MyChart Message (if you have MyChart) OR . A paper copy in the mail If you have any lab test that is abnormal or we need to change your treatment, we will call you to review the results.   Thank you for choosing [facility] at [facility]!!

## 2019-02-17 ENCOUNTER — Telehealth: Payer: Self-pay

## 2019-02-17 NOTE — Telephone Encounter (Signed)
TELEPHONE CALL NOTE  Samantha [name] has been deemed a candidate for a follow-up tele-health visit to limit community exposure during the Covid-19 pandemic. I spoke with the patient via phone to ensure availability of phone/video source, confirm preferred email & phone number, and discuss instructions and expectations.  I reminded Samantha [name] to be prepared with any vital sign and/or heart rhythm information that could potentially be obtained via home monitoring, at the time of her visit. I reminded Samantha [name] to expect a phone call at the time of her visit if her visit.  Did the patient verbally acknowledge consent to treatment? Patient provided verbal consent.  Garey Ham, CMA 02/17/2019 4:50 PM   DOWNLOADING THE WEBEX SOFTWARE TO SMARTPHONE  - If Apple, go to [employer] and type in WebEx in the search bar. Download [employer], the blue/green circle. The app is free but as with any other app downloads, their phone may require them to verify saved payment information or Apple password. The patient does NOT have to create an account.  - If Android, ask patient to go to [employer] and type in WebEx in the search bar. Download [employer], the blue/green circle. The app is free but as with any other app downloads, their phone may require them to verify saved payment information or Android password. The patient does NOT have to create an account.   CONSENT FOR TELE-HEALTH VISIT - PLEASE REVIEW  I hereby voluntarily request, consent and authorize [facility] and its employed or contracted physicians, physician assistants, nurse practitioners or other licensed health care professionals (the Practitioner), to provide me with telemedicine health care services (the "Services") as deemed necessary by the treating Practitioner. I acknowledge and consent to receive the Services by the Practitioner via telemedicine. I understand that the telemedicine visit will involve  communicating with the Practitioner through live audiovisual communication technology and the disclosure of certain medical information by electronic transmission. I acknowledge that I have been given the opportunity to request an in-person assessment or other available alternative prior to the telemedicine visit and am voluntarily participating in the telemedicine visit.  I understand that I have the right to withhold or withdraw my consent to the use of telemedicine in the course of my care at any time, without affecting my right to future care or treatment, and that the Practitioner or I may terminate the telemedicine visit at any time. I understand that I have the right to inspect all information obtained and/or recorded in the course of the telemedicine visit and may receive copies of available information for a reasonable fee.  I understand that some of the potential risks of receiving the Services via telemedicine include:  Marland Kitchen Delay or interruption in medical evaluation due to technological equipment failure or disruption; . Information transmitted may not be sufficient (e.g. poor resolution of images) to allow for appropriate medical decision making by the Practitioner; and/or  . In rare instances, security protocols could fail, causing a breach of personal health information.  Furthermore, I acknowledge that it is my responsibility to provide information about my medical history, conditions and care that is complete and accurate to the best of my ability. I acknowledge that Practitioner's advice, recommendations, and/or decision may be based on factors not within their control, such as incomplete or inaccurate data provided by me or distortions of diagnostic images or specimens that may result from electronic transmissions. I understand that the practice of medicine  is not an Visual merchandiser and that Practitioner makes no warranties or guarantees regarding treatment outcomes. I acknowledge that I will  receive a copy of this consent concurrently upon execution via email to the email address I last provided but may also request a printed copy by calling the office of [facility].    I understand that my insurance will be billed for this visit.   I have read or had this consent read to me. . I understand the contents of this consent, which adequately explains the benefits and risks of the Services being provided via telemedicine.  . I have been provided ample opportunity to ask questions regarding this consent and the Services and have had my questions answered to my satisfaction. . I give my informed consent for the services to be provided through the use of telemedicine in my medical care  By participating in this telemedicine visit I agree to the above.

## 2019-02-18 ENCOUNTER — Encounter: Payer: Self-pay | Admitting: Cardiology

## 2019-02-19 ENCOUNTER — Other Ambulatory Visit: Payer: Self-pay | Admitting: *Deleted

## 2019-02-19 ENCOUNTER — Telehealth (INDEPENDENT_AMBULATORY_CARE_PROVIDER_SITE_OTHER): Payer: Medicare Other | Admitting: Cardiology

## 2019-02-19 DIAGNOSIS — I482 Chronic atrial fibrillation, unspecified: Secondary | ICD-10-CM

## 2019-02-19 DIAGNOSIS — I5022 Chronic systolic (congestive) heart failure: Secondary | ICD-10-CM

## 2019-02-19 NOTE — Patient Instructions (Signed)

## 2019-02-19 NOTE — Progress Notes (Signed)
Virtual Visit via Telephone Note    Evaluation Performed:  Follow-up visit  This visit type was conducted due to national recommendations for restrictions regarding the COVID-19 Pandemic (e.g. social distancing).  This format is felt to be most appropriate for this patient at this time.  All issues noted in this document were discussed and addressed.  No physical exam was performed (except for noted visual exam findings with Video Visits).  Please refer to the patient's chart (MyChart message for video visits and phone note for telephone visits) for the patient's consent to telehealth for [facility].  Date:  02/19/2019   ID:  Samantha [name], DOB 03-22-1927, MRN [id number]  Patient Location:  Home address  Provider location:   [place] Office  PCP:  Hadley Pen, MD  Cardiologist:  Rollene Rotunda, MD  Electrophysiologist:  None   Chief Complaint:  Palpitations  History of Present Illness:    Samantha [name] is a 85 y.o. female who presents via audio/video conferencing for a telehealth visit today.    The patient has atrial fibrillation, history of DCCV which was temporarily successful but she reverted back to atrial fibrillation.  She was continued on Eliquis for anticoagulation with a CHADS VASC  score of 5.  Other history includes hypertension, hyperglycemia, chronic systolic heart failure with EF of 35% to 40%.  Since she was last seen she is done well.  She lives in a retirement community.  She does not notice her atrial fibrillation.  She is legally blind.  She does not go out very much and she is being protected against the virus with this.    The patient does not have symptoms concerning for COVID-19 infection (fever, chills, cough, or new shortness of breath).    Prior CV studies:   The following studies were reviewed today:   Past Medical History:  Diagnosis Date  . Acute CHF (HCC)   . Hyperglycemia   . Hypertension   . Kidney stones, calcium oxalate   . Persistent  atrial fibrillation with rapid ventricular response 01/2018   Past Surgical History:  Procedure Laterality Date  . CARDIOVERSION N/A 03/02/2018   Procedure: CARDIOVERSION;  Surgeon: Wendall Stade, MD;  Location: [facility] ENDOSCOPY;  Service: Cardiovascular;  Laterality: N/A;     No outpatient medications have been marked as taking for the 02/19/19 encounter (Appointment) with Rollene Rotunda, MD.     Allergies:   Tape   Social History   Tobacco Use  . Smoking status: Never Smoker  . Smokeless tobacco: Never Used  Substance Use Topics  . Alcohol use: No    Frequency: Never  . Drug use: No    Prior to Admission medications   Medication Sig Start Date End Date Taking? Authorizing Provider  acetaminophen (TYLENOL) 500 MG tablet Take 500 mg by mouth every 6 (six) hours as needed for moderate pain or headache.     [provider]  apixaban (ELIQUIS) 5 MG TABS tablet Take 1 tablet (5 mg total) by mouth 2 (two) times daily. 02/24/18   Rollene Rotunda, MD  carvedilol (COREG) 25 MG tablet Take 25 mg by mouth 2 (two) times daily. 11/04/17   [provider]  D-Mannose POWD Take 5 mLs by mouth daily. Mix in 8 oz water and drink    [provider]  diltiazem (CARDIZEM CD) 120 MG 24 hr capsule Take 1 capsule (120 mg total) by mouth daily. 01/29/18   Joseph Art, DO  furosemide (LASIX)  20 MG tablet Take 40 mg by mouth 2 (two) times daily. 03/21/18   [provider]  hydrocortisone 2.5 % cream Apply 1 application topically 2 (two) times daily.     [provider]  hyoscyamine (LEVSIN SL) 0.125 MG SL tablet Take 0.125 mg by mouth every evening.  12/08/17   [provider]  latanoprost (XALATAN) 0.005 % ophthalmic solution Place 1 drop into both eyes at bedtime. 01/05/18   [provider]  losartan (COZAAR) 25 MG tablet Take 1 tablet (25 mg total) by mouth daily. 07/29/18 10/27/18  Jodelle Gross, NP  magnesium oxide (MAG-OX) 400 MG tablet Take  1 tablet by mouth 2 (two) times daily.    [provider]  Multiple Vitamins-Minerals (ICAPS PO) Take 1 capsule by mouth 2 (two) times daily.    [provider]  Omega-3 Fatty Acids (FISH OIL) 1000 MG CAPS Take 1,000 mg by mouth daily.    [provider]  pantoprazole (PROTONIX) 40 MG tablet Take 40 mg by mouth daily. 11/04/17   [provider]  Polyethyl Glycol-Propyl Glycol (SYSTANE OP) Place 1 drop into both eyes 2 (two) times daily.    [provider]  potassium chloride (K-DUR) 10 MEQ tablet Take 2 tablets (20 mEq total) by mouth daily. 03/05/18   Dimple Nanas, MD     Family Hx: The patient's family history includes Heart disease in her father and mother; Heart failure in her daughter.  ROS:   Please see the history of present illness.    As stated in the HPI and negative for all other systems.   Labs/Other Tests and Data Reviewed:    Recent Labs: 03/03/2018: B Natriuretic Peptide 158.2 03/04/2018: Hemoglobin 11.6; Platelets 127 03/06/2018: Magnesium 1.6 07/29/2018: BUN 17; Creatinine, Ser 0.85; Potassium 4.3; Sodium 145   Recent Lipid Panel No results found for: CHOL, TRIG, HDL, CHOLHDL, LDLCALC, LDLDIRECT  Wt Readings from Last 3 Encounters:  08/31/18 160 lb (72.6 kg)  07/29/18 157 lb 12.8 oz (71.6 kg)  07/13/18 160 lb (72.6 kg)     Objective:    Vital Signs:  LMP  (LMP Unknown)     ASSESSMENT & PLAN:    Chronic Systolic CHF:  She has no acute complaints.  No change in therapy.   Atrial Fib:    She does not feel this.  No change in therapy.  She tolerates anticoagulation.  Legally Blind: Has assistance and also pre-packaged pills for each time of day. This is working well for her.   COVID-19 Education: The signs and symptoms of COVID-19 were discussed with the patient and how to seek care for testing (follow up with PCP or arrange E-visit).  The importance of social distancing was discussed today.  Patient Risk:    After full review of this patient's clinical status, I feel that they are at least moderate risk at this time.  Time:   Today, I have spent 12 minutes with the patient with telehealth technology discussing the above.     Medication Adjustments/Labs and Tests Ordered: Current medicines are reviewed at length with the patient today.  Concerns regarding medicines are outlined above.  Tests Ordered: No orders of the defined types were placed in this encounter.  Medication Changes: No orders of the defined types were placed in this encounter.   Disposition:  Follow up in 6 month(s)  Signed, Rollene Rotunda, MD  02/19/2019 1:10 PM    [facility]

## 2019-03-04 ENCOUNTER — Ambulatory Visit: Payer: Medicare Other | Admitting: Cardiology

## 2019-04-22 ENCOUNTER — Telehealth: Payer: Self-pay | Admitting: Cardiology

## 2019-04-22 ENCOUNTER — Telehealth: Payer: Self-pay

## 2019-04-22 DIAGNOSIS — I10 Essential (primary) hypertension: Secondary | ICD-10-CM

## 2019-04-22 NOTE — Telephone Encounter (Signed)
lmtcb

## 2019-04-22 NOTE — Telephone Encounter (Signed)
Daughter called back with reading 88/40 HR 66

## 2019-04-22 NOTE — Telephone Encounter (Signed)
° ° °  Patient's daughter call to report elevated HR and sweating.. Daughter states she was going to her mother's house to get HR reading.

## 2019-04-22 NOTE — Telephone Encounter (Signed)
Patients daughter returned call from the message that was left earlier today. The daughter stated that when her mother woke up this morning she felt like her heart was racing she was weak and sweaty. She took her morning medications. At some point in the morning her blood pressure was taken and it was 88/40 with a heart rate of 66. An hour later it was 104/70 with a heart rate of 66. At 1:53pm it was 100/54 with a heart rate of 73. At 2:30 pm her blood pressure was 91/56 with a heart rate of 120. Patient reports no chest pain or discomfort. Will send to the preop pool for recommendations.

## 2019-04-22 NOTE — Telephone Encounter (Signed)
Samantha [name] sent orders via secure chat to decrease the patients Coreg to 12.5 mg BID and the lasix to one time a day. If the blood pressure is low again tomorrow or she has another "sweaty" episode she will need to be seen in the office on flex clinic APP schedule. The patient also needs a CBC and BMP in the morning and run as STAT. Called the patient's daughter and informed her of the medication changes and the need for the CBC and BMP in the morning. Patients daughter verbalized understanding. Lab's ordered and will give a copy to the [facility] team member in the office.

## 2019-04-23 ENCOUNTER — Telehealth: Payer: Self-pay | Admitting: Cardiology

## 2019-04-23 LAB — BASIC METABOLIC PANEL
BUN/Creatinine Ratio: 22 (ref 12–28)
BUN: 24 mg/dL (ref 8–27)
CO2: 29 mmol/L (ref 20–29)
Calcium: 9.9 mg/dL (ref 8.7–10.3)
Chloride: 105 mmol/L (ref 96–106)
Creatinine, Ser: 1.11 mg/dL — ABNORMAL HIGH (ref 0.57–1.00)
GFR calc Af Amer: 51 mL/min/{1.73_m2} — ABNORMAL LOW (ref >59)
GFR calc non Af Amer: 44 mL/min/{1.73_m2} — ABNORMAL LOW (ref >59)
Glucose: 137 mg/dL — ABNORMAL HIGH (ref 65–99)
Potassium: 4.6 mmol/L (ref 3.5–5.2)
Sodium: 142 mmol/L (ref 134–144)

## 2019-04-23 LAB — CBC
Hematocrit: 42.3 % (ref 34.0–46.6)
Hemoglobin: 14 g/dL (ref 11.1–15.9)
MCH: 30.5 pg (ref 26.6–33.0)
MCHC: 33.1 g/dL (ref 31.5–35.7)
MCV: 92 fL (ref 79–97)
Platelets: 143 10*3/uL — ABNORMAL LOW (ref 150–450)
RBC: 4.59 x10E6/uL (ref 3.77–5.28)
RDW: 14.5 % (ref 11.7–15.4)
WBC: 7.9 10*3/uL (ref 3.4–10.8)

## 2019-04-23 NOTE — Telephone Encounter (Signed)
Called to see how patient was doing and let her know her labs looked good. Left a message to call back if she is still have issues (low B/P).  Corine Shelter PA-C 04/23/2019 2:37 PM

## 2019-04-23 NOTE — Telephone Encounter (Signed)
This was addressed in a message yesterday to [name] RN- I suggested the patient decrease her Lasix to 40 mg daily, Coreg to to 12.5 mg BID, and suggested she come in for a CBC and BMP today.  If her symptoms recurred she would need to be seen.  Corine Shelter PA-C 04/23/2019 8:10 AM

## 2019-07-12 ENCOUNTER — Telehealth: Payer: Self-pay | Admitting: Cardiology

## 2019-07-12 NOTE — Telephone Encounter (Signed)
  Daughter is calling because her mom woke up this morning feeling hot, sweaty and like her heart may be in Afib. Samantha [name] lives in a nursing home and so daughter called the nurse to go check on her. The nurse took her vitals which were BP 128/92 HR 67, oxygen 96% and temp 97/5 this morning. Patient stated that she was feeling better and daughter is wondering if she needs to be seen. Please advise.

## 2019-07-12 NOTE — Telephone Encounter (Signed)
Left message to call back  

## 2019-07-15 NOTE — Telephone Encounter (Signed)
Spoke with daughter and pt is feeling better and is aware of upcoming appt ./cy

## 2019-08-18 NOTE — Progress Notes (Signed)
Cardiology Office Note   Date:  08/19/2019   ID:  Samantha, [name] [birth year], MRN [id number]  PCP:  Samantha Broker, MD  Cardiologist:   Samantha Breeding, MD   No chief complaint on file.     History of Present Illness: Samantha [name] is a 85 y.o. female who presents for evaluation of atrial fib.   She was treated with Eliquis and had successful DCCV last year.  However, she went back into atrial fib before discharge.  She was also found to have a reduced EF of 35 - 40%.  She was in sinus when I saw her last year.  She lives in a retirement community and she is legally blind.  I did have a telehealth appt with her earlier this year.    She seems to be doing relatively well.  She comes with her caregiver.  She is using her rolling walker.  She says she does fall and she was supposed to work with physical therapy until the pandemic.  She does not fall if she uses the walker.  She does not have any syncope.  She feels her heart racing occasionally.  She has not had any new shortness of breath, PND or orthopnea.  She is not had any weight gain or edema.  She tolerates anticoagulation.  Past Medical History:  Diagnosis Date  . Acute CHF ([place])   . Hyperglycemia   . Hypertension   . Kidney stones, calcium oxalate   . Persistent atrial fibrillation with rapid ventricular response ([place]) 01/2018    Past Surgical History:  Procedure Laterality Date  . CARDIOVERSION N/A 03/02/2018   Procedure: CARDIOVERSION;  Surgeon: Josue Hector, MD;  Location: [facility] ENDOSCOPY;  Service: Cardiovascular;  Laterality: N/A;     Current Outpatient Medications  Medication Sig Dispense Refill  . acetaminophen (TYLENOL) 500 MG tablet Take 500 mg by mouth every 6 (six) hours as needed for moderate pain or headache.     Marland Kitchen apixaban (ELIQUIS) 5 MG TABS tablet Take 1 tablet (5 mg total) by mouth 2 (two) times daily. 180 tablet 3  . carvedilol (COREG) 25 MG tablet Take 25 mg by mouth 2 (two) times daily.  0  .  D-Mannose POWD Take 5 mLs by mouth daily. Mix in 8 oz water and drink    . diltiazem (CARDIZEM CD) 120 MG 24 hr capsule Take 1 capsule (120 mg total) by mouth daily. 30 capsule 0  . furosemide (LASIX) 20 MG tablet Take 40 mg by mouth 2 (two) times daily.  0  . hydrocortisone 2.5 % cream Apply 1 application topically 2 (two) times daily.     . hyoscyamine (LEVSIN SL) 0.125 MG SL tablet Take 0.125 mg by mouth every evening.   3  . latanoprost (XALATAN) 0.005 % ophthalmic solution Place 1 drop into both eyes at bedtime.  12  . losartan (COZAAR) 25 MG tablet Take 1 tablet (25 mg total) by mouth daily. 90 tablet 3  . magnesium oxide (MAG-OX) 400 MG tablet Take 1 tablet by mouth 2 (two) times daily.    . Multiple Vitamins-Minerals (ICAPS PO) Take 1 capsule by mouth 2 (two) times daily.    . Omega-3 Fatty Acids (FISH OIL) 1000 MG CAPS Take 1,000 mg by mouth daily.    . pantoprazole (PROTONIX) 40 MG tablet Take 40 mg by mouth daily.  1  . Polyethyl Glycol-Propyl Glycol (SYSTANE OP) Place 1 drop into both eyes 2 (two) times  daily.    . potassium chloride (K-DUR) 10 MEQ tablet Take 2 tablets (20 mEq total) by mouth daily. 30 tablet 0   No current facility-administered medications for this visit.     Allergies:   Tape    ROS:  Please see the history of present illness.   Otherwise, review of systems are positive for none.   All other systems are reviewed and negative.    PHYSICAL EXAM: VS:  BP (!) 134/94   Pulse (!) 127   Ht 5\' 2"  (1.575 m)   Wt 155 lb (70.3 kg)   LMP  (LMP Unknown)   BMI 28.35 kg/m  , BMI Body mass index is 28.35 kg/m. GENERAL:  Well appearing NECK:  No jugular venous distention, waveform within normal limits, carotid upstroke brisk and symmetric, no bruits, no thyromegaly LUNGS:  Clear to auscultation bilaterally CHEST:  Unremarkable BACK:  Lordosis.   HEART:  PMI not displaced or sustained,S1  within normal limits, no S3, no S4, no clicks, no rubs, no murmurs, irregular  ABD:  Flat, positive bowel sounds normal in frequency in pitch, no bruits, no rebound, no guarding, no midline pulsatile mass, no hepatomegaly, no splenomegaly EXT:  2 plus pulses throughout, no edema, no cyanosis no clubbing   EKG:  EKG is  ordered today. Atrial fibrillation, rate 127, leftward axis, QTC prolonged, no acute ST-T wave changes.   Recent Labs: 04/23/2019: BUN 24; Creatinine, Ser 1.11; Hemoglobin 14.0; Platelets 143; Potassium 4.6; Sodium 142    Lipid Panel No results found for: CHOL, TRIG, HDL, CHOLHDL, VLDL, LDLCALC, LDLDIRECT    Wt Readings from Last 3 Encounters:  08/19/19 155 lb (70.3 kg)  08/31/18 160 lb (72.6 kg)  07/29/18 157 lb 12.8 oz (71.6 kg)      Other studies Reviewed: Additional studies/ records that were reviewed today include:   Review of the above records demonstrates:  Please see elsewhere in the note.     ASSESSMENT AND PLAN:  ATRIAL FIB:   She seems to be maintaining sinus rhythm.  She tolerates anticoagulation.   [name] has a CHA2DS2 - VASc score of 5.  Her rate is not controlled.  I am getting increase her Cardizem to 180 mg daily.  She is already on target beta-blocker.  She might benefit from digoxin in the future.  We had a long conversation about the risk of falls and avoiding these and she might need to work with physical therapy but I will defer to her primary providers.  CHRONIC SYSTOLIC HF: Despite the reduced ejection fraction she seems to be euvolemic and I think tolerated the Cardizem.  Therefore, I will go up as above.  If in the future blood pressure allows and her heart rate is controlled we could switch to [facility] for her reduced ejection fraction.   HTN:   This is being managed in the context of treating his CHF.  Current medicines are reviewed at length with the patient today.  The patient does not have concerns regarding medicines.  The following changes have been made:  As above.    Labs/ tests ordered today  include: None  No orders of the defined types were placed in this encounter.    Disposition:   Follow with [facility] DNP in 3 months.    Signed, Joni Reining, MD  08/19/2019 10:07 AM    [facility]

## 2019-08-19 ENCOUNTER — Ambulatory Visit (INDEPENDENT_AMBULATORY_CARE_PROVIDER_SITE_OTHER): Payer: Medicare Other | Admitting: Cardiology

## 2019-08-19 ENCOUNTER — Other Ambulatory Visit: Payer: Self-pay

## 2019-08-19 ENCOUNTER — Ambulatory Visit: Payer: Medicare Other | Admitting: Cardiology

## 2019-08-19 ENCOUNTER — Encounter: Payer: Self-pay | Admitting: Cardiology

## 2019-08-19 VITALS — BP 134/94 | HR 127 | Ht 62.0 in | Wt 155.0 lb

## 2019-08-19 DIAGNOSIS — I480 Paroxysmal atrial fibrillation: Secondary | ICD-10-CM

## 2019-08-19 DIAGNOSIS — I5022 Chronic systolic (congestive) heart failure: Secondary | ICD-10-CM | POA: Diagnosis not present

## 2019-08-19 DIAGNOSIS — I10 Essential (primary) hypertension: Secondary | ICD-10-CM | POA: Diagnosis not present

## 2019-08-19 MED ORDER — DILTIAZEM HCL ER COATED BEADS 180 MG PO CP24: 180.0000 mg | ORAL_CAPSULE | Freq: Every day | ORAL | 3 refills | Status: DC

## 2019-08-19 NOTE — Patient Instructions (Addendum)
Medication Instructions:  Stop taking 120mg  Cardizem. Start taking 180mg  Cardizem daily.  If you need a refill on your cardiac medications before your next appointment, please call your pharmacy.   Lab work: NONE  Testing/Procedures: NONE  Follow-Up: At [facility], you and your health needs are our priority.  As part of our continuing mission to provide you with exceptional heart care, we have created designated Provider Care Teams.  These Care Teams include your primary Cardiologist (physician) and Advanced Practice Providers (APPs -  Physician Assistants and Nurse Practitioners) who all work together to provide you with the care you need, when you need it. You will need a follow up appointment in 6 months.  Please call our office 2 months in advance to schedule this appointment.  You may see Minus Breeding, MD or one of the following Advanced Practice Providers on your designated Care Team:   Samantha Ferries, PA-C Samantha Sims, DNP, ANP  Any Other Special Instructions Will Be Listed Below (If Applicable). Schedule an appointment with Samantha Sims, DNP in 3 months.

## 2019-10-05 ENCOUNTER — Telehealth: Payer: Self-pay | Admitting: Cardiology

## 2019-10-05 NOTE — Telephone Encounter (Signed)
Advised ok to change per Raquel Pharm D

## 2019-10-05 NOTE — Telephone Encounter (Signed)
° °  1. Name of Medication: diltiazem (CARDIZEM CD) 180 MG 24 hr capsule  2. How are you currently taking this medication (dosage and times per day)? As directed  3. Are you having a reaction (difficulty breathing--STAT)? no  4. What is your medication issue? Pharmacy wanted to know if it was OK with Dr. Percival Spanish to change the brand for the patient's medication. The patient reported difficulty swallowing one brand, and the pharmacy has a smaller pill that would be easier for her to take.

## 2019-11-22 NOTE — Progress Notes (Signed)
Cardiology Office Note   Date:  11/24/2019   ID:  Samantha [name], DOB Dec 31, 1926, MRN [id number]  PCP:  Myrlene Broker, MD  Cardiologist:  Percival Spanish  CC: Follow Up    History of Present Illness: Samantha [name] is a 86 y.o. female who presents for ongoing assessment and management of atrial fib treated with DCCV 03/02/2018, on Eliquis, CHADS VASC Score of 5 (Age,LV dysfunction, HTN, Female) LV systolic dysfunction with EF of 40%-45% on 08/10/2018 echocardiogram, hypertension.  Last seen by Dr.Hochrein on 08/19/2019, and HR was not found to be well controlled. Diltiazem was increased to 180 mg daily, continued on carvedilol 25 mg BID, with consideration to add digoxin.  Also,consideration to switch to Entresto from Diltiazem if she could not tolerate higher dose.   She is doing very well today. Tolerated higher dose of Diltiazem without complaints of chest pain, dizziness, LEE, or DOE. She denies any bleeding on Eliquis. She is legally blind and has care taker with her today.   Past Medical History:  Diagnosis Date  . Acute CHF (Samantha [name])   . Hyperglycemia   . Hypertension   . Kidney stones, calcium oxalate   . Persistent atrial fibrillation with rapid ventricular response (Samantha [name]) 01/2018    Past Surgical History:  Procedure Laterality Date  . CARDIOVERSION N/A 03/02/2018   Procedure: CARDIOVERSION;  Surgeon: Samantha Hector, MD;  Location: [facility] ENDOSCOPY;  Service: Cardiovascular;  Laterality: N/A;     Current Outpatient Medications  Medication Sig Dispense Refill  . acetaminophen (TYLENOL) 500 MG tablet Take 500 mg by mouth every 6 (six) hours as needed for moderate pain or headache.     Marland Kitchen apixaban (ELIQUIS) 5 MG TABS tablet Take 1 tablet (5 mg total) by mouth 2 (two) times daily. 180 tablet 3  . carvedilol (COREG) 12.5 MG tablet Take 12.5 mg by mouth 2 (two) times daily.    . D-Mannose POWD Take 5 mLs by mouth daily. Mix in 8 oz water and drink    . furosemide (LASIX) 40 MG tablet Take 20 mg by  mouth 2 (two) times daily.    . hydrocortisone 2.5 % cream Apply 1 application topically 2 (two) times daily.     . hyoscyamine (LEVSIN SL) 0.125 MG SL tablet Take 0.125 mg by mouth every evening.   3  . latanoprost (XALATAN) 0.005 % ophthalmic solution Place 1 drop into both eyes at bedtime.  12  . losartan (COZAAR) 25 MG tablet Take 1 tablet (25 mg total) by mouth daily. 90 tablet 3  . magnesium oxide (MAG-OX) 400 MG tablet Take 1 tablet by mouth 2 (two) times daily.    . Multiple Vitamins-Minerals (ICAPS PO) Take 1 capsule by mouth 2 (two) times daily.    . Omega-3 Fatty Acids (FISH OIL) 1000 MG CAPS Take 1,000 mg by mouth daily.    . pantoprazole (PROTONIX) 40 MG tablet Take 40 mg by mouth daily.  1  . Polyethyl Glycol-Propyl Glycol (SYSTANE OP) Place 1 drop into both eyes 2 (two) times daily.    Marland Kitchen diltiazem (CARDIZEM CD) 180 MG 24 hr capsule Take 1 capsule (180 mg total) by mouth daily. 90 capsule 3   No current facility-administered medications for this visit.    Allergies:   Tape    Social History:  The patient  reports that she has never smoked. She has never used smokeless tobacco. She reports that she does not drink alcohol or use drugs.   Family History:  The patient's family history includes Heart disease in her father and mother; Heart failure in her daughter.    ROS: All other systems are reviewed and negative. Unless otherwise mentioned in H&P    PHYSICAL EXAM: VS:  BP 112/82   Pulse 97   Ht 5\' 2"  (1.575 m)   Wt 151 lb 9.6 oz (68.8 kg)   LMP  (LMP Unknown)   BMI 27.73 kg/m  , BMI Body mass index is 27.73 kg/m. GEN: Well nourished, well developed, in no acute distress HEENT: normal Neck: no JVD, carotid bruits, or masses Cardiac: IRRR; no murmurs, rubs, or gallops,no edema  Respiratory:  Clear to auscultation bilaterally, normal work of breathing GI: soft, nontender, nondistended, + BS MS: no deformity or atrophy, significant kyphosis is noted.  Skin: warm and  dry, no rash Neuro:  Strength and sensation are intact Psych: euthymic mood, full affect   EKG: Not completed.  Recent Labs: 04/23/2019: BUN 24; Creatinine, Ser 1.11; Hemoglobin 14.0; Platelets 143; Potassium 4.6; Sodium 142    Lipid Panel No results found for: CHOL, TRIG, HDL, CHOLHDL, VLDL, LDLCALC, LDLDIRECT    Wt Readings from Last 3 Encounters:  11/24/19 151 lb 9.6 oz (68.8 kg)  08/19/19 155 lb (70.3 kg)  08/31/18 160 lb (72.6 kg)      Other studies Reviewed: Echocardiogram 08-12-2018  Left ventricle: Diffuse hypokinesis worse in the septum. Wall   thickness was increased in a pattern of mild LVH. Systolic   function was mildly to moderately reduced. The estimated ejection   fraction was in the range of 40% to 45%. Left ventricular   diastolic function parameters were normal. - Aortic valve: There was trivial regurgitation. - Left atrium: The atrium was mildly dilated. - Atrial septum: No defect or patent foramen ovale was identified.  ASSESSMENT AND PLAN:  1.Atrial fib: Heart rate is much lower on ascultation, with rate of   78 bpm by apical pulse. I will continue her on Diltiazem 180 mg daily as directed. BP is tolerating this dose without symptoms.  Continue Eliquis as directed.   2. Hypertension: Follow closely. Currently stable.   3. Legally Blind: Requires caretaker at office visit.   Current medicines are reviewed at length with the patient today.  I have spent  (  ) minutes dedicated to the care of this patient on the date of this encounter to include pre-visit review of records, assessment, management and diagnostic testing,with shared decision making.  Labs/ tests ordered today include: None   08/12/2018. Samantha [name], ANP, AACC   11/24/2019 2:34 PM    [facility] [address] Office [phone] Fax [phone]  Notice: This dictation was prepared with Dragon dictation along with smaller phrase technology. Any  transcriptional errors that result from this process are unintentional and may not be corrected upon review.

## 2019-11-24 ENCOUNTER — Encounter (INDEPENDENT_AMBULATORY_CARE_PROVIDER_SITE_OTHER): Payer: Self-pay

## 2019-11-24 ENCOUNTER — Encounter: Payer: Self-pay | Admitting: Adult Health

## 2019-11-24 ENCOUNTER — Other Ambulatory Visit: Payer: Self-pay

## 2019-11-24 ENCOUNTER — Ambulatory Visit (INDEPENDENT_AMBULATORY_CARE_PROVIDER_SITE_OTHER): Payer: Medicare Other | Admitting: Adult Health

## 2019-11-24 VITALS — BP 112/82 | HR 97 | Ht 62.0 in | Wt 151.6 lb

## 2019-11-24 DIAGNOSIS — I10 Essential (primary) hypertension: Secondary | ICD-10-CM | POA: Diagnosis not present

## 2019-11-24 DIAGNOSIS — H543 Unqualified visual loss, both eyes: Secondary | ICD-10-CM | POA: Diagnosis not present

## 2019-11-24 DIAGNOSIS — I4819 Other persistent atrial fibrillation: Secondary | ICD-10-CM

## 2019-11-24 MED ORDER — DILTIAZEM HCL ER COATED BEADS 180 MG PO CP24: 180.0000 mg | ORAL_CAPSULE | Freq: Every day | ORAL | 3 refills | Status: AC

## 2019-11-24 NOTE — Patient Instructions (Signed)
Medication Instructions:  Continue current medications  *If you need a refill on your cardiac medications before your next appointment, please call your pharmacy*  Lab Work: None Ordered  Testing/Procedures: None Ordered  Follow-Up: At [facility], you and your health needs are our priority.  As part of our continuing mission to provide you with exceptional heart care, we have created designated Provider Care Teams.  These Care Teams include your primary Cardiologist (physician) and Advanced Practice Providers (APPs -  Physician Assistants and Nurse Practitioners) who all work together to provide you with the care you need, when you need it.  Your next appointment:   6 month(s)  The format for your next appointment:   In Person  Provider:   Rollene Rotunda, MD  Other Instructions

## 2020-05-30 DIAGNOSIS — Z7189 Other specified counseling: Secondary | ICD-10-CM | POA: Insufficient documentation

## 2020-05-30 NOTE — Progress Notes (Signed)
Cardiology Office Note   Date:  06/01/2020   ID:  Samantha [name], DOB 1927-02-24, MRN [id number]  PCP:  Hadley Pen, MD  Cardiologist:   Rollene Rotunda, MD   Chief Complaint  Patient presents with  . Atrial Fibrillation      History of Present Illness: Samantha [name] is a 86 y.o. female who presents for ongoing assessment and management of atrial fib .  She had DCCV in 2019.   She had recurrent atrial fib.     She is legally blind.  She lives in a retirement home.  She has a caregiver.  She gets around with a walker.  However, she seems to do relatively well.  She does feel her heart beating out of rhythm slightly but it does not bother her.  She does not know whether is going fast or slow.  She has very infrequent episodes of dizziness.  Says it passes very quickly.  She does not have any presyncope or syncope.  She does not think she has any trouble with her blood thinner but she cannot see her bowel movements.  She does not have any chest pressure, neck or arm discomfort.  She has no shortness of breath, PND or orthopnea.  She has had no weight gain or edema.   Past Medical History:  Diagnosis Date  . Acute CHF (HCC)   . Hyperglycemia   . Hypertension   . Kidney stones, calcium oxalate   . Persistent atrial fibrillation with rapid ventricular response (HCC) 01/2018    Past Surgical History:  Procedure Laterality Date  . CARDIOVERSION N/A 03/02/2018   Procedure: CARDIOVERSION;  Surgeon: Wendall Stade, MD;  Location: [facility] ENDOSCOPY;  Service: Cardiovascular;  Laterality: N/A;     Current Outpatient Medications  Medication Sig Dispense Refill  . acetaminophen (TYLENOL) 500 MG tablet Take 500 mg by mouth every 6 (six) hours as needed for moderate pain or headache.     Marland Kitchen apixaban (ELIQUIS) 5 MG TABS tablet Take 1 tablet (5 mg total) by mouth 2 (two) times daily. 180 tablet 3  . carvedilol (COREG) 12.5 MG tablet Take 12.5 mg by mouth 2 (two) times daily.    . D-Mannose  POWD Take 5 mLs by mouth daily. Mix in 8 oz water and drink    . diltiazem (CARDIZEM CD) 180 MG 24 hr capsule Take 1 capsule (180 mg total) by mouth daily. 90 capsule 3  . furosemide (LASIX) 40 MG tablet Take 20 mg by mouth 2 (two) times daily.    . hydrocortisone 2.5 % cream Apply 1 application topically 2 (two) times daily.     . hyoscyamine (LEVSIN SL) 0.125 MG SL tablet Take 0.125 mg by mouth every evening.   3  . latanoprost (XALATAN) 0.005 % ophthalmic solution Place 1 drop into both eyes at bedtime.  12  . magnesium oxide (MAG-OX) 400 MG tablet Take 1 tablet by mouth 2 (two) times daily.    . Multiple Vitamins-Minerals (ICAPS PO) Take 1 capsule by mouth 2 (two) times daily.    . Omega-3 Fatty Acids (FISH OIL) 1000 MG CAPS Take 1,000 mg by mouth daily.    . pantoprazole (PROTONIX) 40 MG tablet Take 40 mg by mouth daily.  1  . Polyethyl Glycol-Propyl Glycol (SYSTANE OP) Place 1 drop into both eyes 2 (two) times daily.    Marland Kitchen losartan (COZAAR) 25 MG tablet Take 1 tablet (25 mg total) by mouth daily. 90 tablet 3  No current facility-administered medications for this visit.    Allergies:   Tape    ROS:  Please see the history of present illness.   Otherwise, review of systems are positive for none.   All other systems are reviewed and negative.    PHYSICAL EXAM: VS:  BP 126/60   Pulse (!) 48   Ht 5\' 2"  (1.575 m)   Wt 136 lb 12.8 oz (62.1 kg)   LMP  (LMP Unknown)   SpO2 94%   BMI 25.02 kg/m  , BMI Body mass index is 25.02 kg/m. GEN:  No distress NECK:  No jugular venous distention at 90 degrees, waveform within normal limits, carotid upstroke brisk and symmetric, no bruits, no thyromegaly LYMPHATICS:  No cervical adenop CHEST:  Unremarkable HEART:  S1 and S2 within normal limits, no S3, no clicks, no rubs, no murmurs, irregular ABD:  Positive bowel sounds normal in frequency in pitch, no bruits, no rebound, no guarding, unable to assess midline mass or bruit with the patient  seated. EXT:  2 plus pulses throughout, no edema, no cyanosis no clubbing  EKG:  EKG is not ordered today.    Recent Labs: No results found for requested labs within last 8760 hours.    Lipid Panel No results found for: CHOL, TRIG, HDL, CHOLHDL, VLDL, LDLCALC, LDLDIRECT    Wt Readings from Last 3 Encounters:  06/01/20 136 lb 12.8 oz (62.1 kg)  11/24/19 151 lb 9.6 oz (68.8 kg)  08/19/19 155 lb (70.3 kg)      Other studies Reviewed: Additional studies/ records that were reviewed today include: Labs. Review of the above records demonstrates:  Please see elsewhere in the note.     ASSESSMENT AND PLAN:  CHRONIC ATRIAL FIB:   She is in chronic atrial fibrillation.  She tolerates this.  I talked to her caregiver about getting a pulse oximeter to make sure she has adequate rate control.  She has some vague mild dizzy episodes and if these increase I would like to monitor her more closely and probably reduce some of her medicines.  However, it does not seem like rate now that these are related to any profound bradycardia arrhythmias.  I am getting check a CBC because recent blood work drawn at [facility] did not include this.  She is on the appropriate dose of Eliquis.  CARDIOMYOPATHY: She had a mild induced ejection fraction but seems euvolemic.  No change in therapy.  HTN: Her blood pressure is well controlled.  No change in therapy.  COVID EDUCATION: She has been vaccinated.  Current medicines are reviewed at length with the patient today.  The patient does not have concerns regarding medicines.  The following changes have been made:  no change  Labs/ tests ordered today include:   Orders Placed This Encounter  Procedures  . CBC     Disposition:   FU with me in one year.     Signed, [facility], MD  06/01/2020 11:40 AM    [facility]

## 2020-06-01 ENCOUNTER — Ambulatory Visit (INDEPENDENT_AMBULATORY_CARE_PROVIDER_SITE_OTHER): Payer: Medicare Other | Admitting: Cardiology

## 2020-06-01 ENCOUNTER — Encounter: Payer: Self-pay | Admitting: Cardiology

## 2020-06-01 ENCOUNTER — Other Ambulatory Visit: Payer: Self-pay

## 2020-06-01 VITALS — BP 126/60 | HR 48 | Ht 62.0 in | Wt 136.8 lb

## 2020-06-01 DIAGNOSIS — I482 Chronic atrial fibrillation, unspecified: Secondary | ICD-10-CM

## 2020-06-01 DIAGNOSIS — I10 Essential (primary) hypertension: Secondary | ICD-10-CM | POA: Diagnosis not present

## 2020-06-01 DIAGNOSIS — Z7189 Other specified counseling: Secondary | ICD-10-CM

## 2020-06-01 NOTE — Patient Instructions (Signed)
Medication Instructions:  NO CHANGE *If you need a refill on your cardiac medications before your next appointment, please call your pharmacy*   Lab Work: Your physician recommends that you HAVE LAB WORK TODAY If you have labs (blood work) drawn today and your tests are completely normal, you will receive your results only by: Marland Kitchen MyChart Message (if you have MyChart) OR . A paper copy in the mail If you have any lab test that is abnormal or we need to change your treatment, we will call you to review the results.  Follow-Up: At [facility], you and your health needs are our priority.  As part of our continuing mission to provide you with exceptional heart care, we have created designated Provider Care Teams.  These Care Teams include your primary Cardiologist (physician) and Advanced Practice Providers (APPs -  Physician Assistants and Nurse Practitioners) who all work together to provide you with the care you need, when you need it.  We recommend signing up for the patient portal called "MyChart".  Sign up information is provided on this After Visit Summary.  MyChart is used to connect with patients for Virtual Visits (Telemedicine).  Patients are able to view lab/test results, encounter notes, upcoming appointments, etc.  Non-urgent messages can be sent to your provider as well.   To learn more about what you can do with MyChart, go to [email or web address].    Your next appointment:   12 month(s)  The format for your next appointment:   In Person  Provider:   You may see Rollene Rotunda, MD or one of the following Advanced Practice Providers on your designated Care Team:    Theodore Demark, PA-C  Joni Reining, DNP, ANP  Cadence Fransico Michael, [place]

## 2020-06-02 ENCOUNTER — Telehealth: Payer: Self-pay | Admitting: Cardiology

## 2020-06-02 LAB — CBC
Hematocrit: 41.3 % (ref 34.0–46.6)
Hemoglobin: 13.9 g/dL (ref 11.1–15.9)
MCH: 31.1 pg (ref 26.6–33.0)
MCHC: 33.7 g/dL (ref 31.5–35.7)
MCV: 92 fL (ref 79–97)
Platelets: 138 10*3/uL — ABNORMAL LOW (ref 150–450)
RBC: 4.47 x10E6/uL (ref 3.77–5.28)
RDW: 13.1 % (ref 11.7–15.4)
WBC: 8.3 10*3/uL (ref 3.4–10.8)

## 2020-06-02 NOTE — Telephone Encounter (Signed)
Advised patient of labs, verbalized understanding  Had already left message for daughter to call back. Per patient ok to give report as she is patients caregiver

## 2020-06-02 NOTE — Telephone Encounter (Signed)
-----   Message from Rollene Rotunda, MD sent at 06/02/2020  7:41 AM EDT ----- Platelets are slightly low but not significantly different than previous.  Call Ms. Dorminey with the results and send results to Hadley Pen, MD

## 2020-06-02 NOTE — Telephone Encounter (Signed)
Follow Up: ° ° ° ° °Pt is returning your call, concerning her lab results. °

## 2020-12-26 ENCOUNTER — Other Ambulatory Visit (INDEPENDENT_AMBULATORY_CARE_PROVIDER_SITE_OTHER): Payer: Self-pay | Admitting: Ophthalmology

## 2021-02-18 NOTE — Progress Notes (Deleted)
Cardiology Office Note   Date:  02/18/2021   ID:  Samantha, Martinson Jul [name], 1928, MRN [id number]  PCP:  Hadley Pen, MD  Cardiologist:   Rollene Rotunda, MD   No chief complaint on file.     History of Present Illness: Samantha [name] is a 87 y.o. female who presents for ongoing assessment and management of atrial fib .  She had DCCV in 2019.   She had recurrent atrial fib. Since I last saw her ***     She is legally blind.  She lives in a retirement home.  She has a caregiver.  She gets around with a walker.  However, she seems to do relatively well.  She does feel her heart beating out of rhythm slightly but it does not bother her.  She does not know whether is going fast or slow.  She has very infrequent episodes of dizziness.  Says it passes very quickly.  She does not have any presyncope or syncope.  She does not think she has any trouble with her blood thinner but she cannot see her bowel movements.  She does not have any chest pressure, neck or arm discomfort.  She has no shortness of breath, PND or orthopnea.  She has had no weight gain or edema.   Past Medical History:  Diagnosis Date  . Acute CHF (HCC)   . Hyperglycemia   . Hypertension   . Kidney stones, calcium oxalate   . Persistent atrial fibrillation with rapid ventricular response (HCC) 01/2018    Past Surgical History:  Procedure Laterality Date  . CARDIOVERSION N/A 03/02/2018   Procedure: CARDIOVERSION;  Surgeon: Wendall Stade, MD;  Location: [facility] ENDOSCOPY;  Service: Cardiovascular;  Laterality: N/A;     Current Outpatient Medications  Medication Sig Dispense Refill  . acetaminophen (TYLENOL) 500 MG tablet Take 500 mg by mouth every 6 (six) hours as needed for moderate pain or headache.     Marland Kitchen apixaban (ELIQUIS) 5 MG TABS tablet Take 1 tablet (5 mg total) by mouth 2 (two) times daily. 180 tablet 3  . carvedilol (COREG) 12.5 MG tablet Take 12.5 mg by mouth 2 (two) times daily.    . D-Mannose POWD Take 5  mLs by mouth daily. Mix in 8 oz water and drink    . diltiazem (CARDIZEM CD) 180 MG 24 hr capsule Take 1 capsule (180 mg total) by mouth daily. 90 capsule 3  . furosemide (LASIX) 40 MG tablet Take 20 mg by mouth 2 (two) times daily.    . hydrocortisone 2.5 % cream Apply 1 application topically 2 (two) times daily.     . hyoscyamine (LEVSIN SL) 0.125 MG SL tablet Take 0.125 mg by mouth every evening.   3  . latanoprost (XALATAN) 0.005 % ophthalmic solution instill 1 drop into each eye AT BEDTIME 2.5 mL 12  . losartan (COZAAR) 25 MG tablet Take 1 tablet (25 mg total) by mouth daily. 90 tablet 3  . magnesium oxide (MAG-OX) 400 MG tablet Take 1 tablet by mouth 2 (two) times daily.    . Multiple Vitamins-Minerals (ICAPS PO) Take 1 capsule by mouth 2 (two) times daily.    . Omega-3 Fatty Acids (FISH OIL) 1000 MG CAPS Take 1,000 mg by mouth daily.    . pantoprazole (PROTONIX) 40 MG tablet Take 40 mg by mouth daily.  1  . Polyethyl Glycol-Propyl Glycol (SYSTANE OP) Place 1 drop into both eyes 2 (two) times daily.  No current facility-administered medications for this visit.    Allergies:   Tape    ROS:  Please see the history of present illness.   Otherwise, review of systems are positive for ***.   All other systems are reviewed and negative.    PHYSICAL EXAM: VS:  LMP  (LMP Unknown)  , BMI There is no height or weight on file to calculate BMI. GENERAL:  Well appearing NECK:  No jugular venous distention, waveform within normal limits, carotid upstroke brisk and symmetric, no bruits, no thyromegaly LUNGS:  Clear to auscultation bilaterally CHEST:  Unremarkable HEART:  PMI not displaced or sustained,S1 and S2 within normal limits, no S3,  no clicks, no rubs, *** murmurs, irregular ABD:  Flat, positive bowel sounds normal in frequency in pitch, no bruits, no rebound, no guarding, no midline pulsatile mass, no hepatomegaly, no splenomegaly EXT:  2 plus pulses throughout, no edema, no cyanosis  no clubbing     ***GEN:  No distress NECK:  No jugular venous distention at 90 degrees, waveform within normal limits, carotid upstroke brisk and symmetric, no bruits, no thyromegaly LYMPHATICS:  No cervical adenop CHEST:  Unremarkable HEART:  S1 and S2 within normal limits, no S3, no clicks, no rubs, no murmurs, irregular ABD:  Positive bowel sounds normal in frequency in pitch, no bruits, no rebound, no guarding, unable to assess midline mass or bruit with the patient seated. EXT:  2 plus pulses throughout, no edema, no cyanosis no clubbing  EKG:  EKG is *** ordered today. ***   Recent Labs: 06/01/2020: Hemoglobin 13.9; Platelets 138    Lipid Panel No results found for: CHOL, TRIG, HDL, CHOLHDL, VLDL, LDLCALC, LDLDIRECT    Wt Readings from Last 3 Encounters:  06/01/20 136 lb 12.8 oz (62.1 kg)  11/24/19 151 lb 9.6 oz (68.8 kg)  08/19/19 155 lb (70.3 kg)      Other studies Reviewed: Additional studies/ records that were reviewed today include: ***. Review of the above records demonstrates:  Please see elsewhere in the note.     ASSESSMENT AND PLAN:  CHRONIC ATRIAL FIB:   *** She is in chronic atrial fibrillation.  She tolerates this.  I talked to her caregiver about getting a pulse oximeter to make sure she has adequate rate control.  She has some vague mild dizzy episodes and if these increase I would like to monitor her more closely and probably reduce some of her medicines.  However, it does not seem like rate now that these are related to any profound bradycardia arrhythmias.  I am getting check a CBC because recent blood work drawn at [facility] did not include this.  She is on the appropriate dose of Eliquis.  CARDIOMYOPATHY: ***  She had a mild induced ejection fraction but seems euvolemic.  No change in therapy.  HTN: Her blood pressure is *** well controlled.  No change in therapy.    Current medicines are reviewed at length with the patient today.  The patient  does not have concerns regarding medicines.  The following changes have been made:  ***  Labs/ tests ordered today include:  ***  No orders of the defined types were placed in this encounter.    Disposition:   FU with me in *** year.     Signed, Rollene Rotunda, MD  02/18/2021 9:42 PM    [facility]

## 2021-02-19 ENCOUNTER — Ambulatory Visit: Payer: Medicare Other | Admitting: Cardiology

## 2021-02-19 ENCOUNTER — Telehealth: Payer: Self-pay | Admitting: *Deleted

## 2021-02-19 NOTE — Telephone Encounter (Signed)
Left message to call back  Scheduled for 4/8 am

## 2021-02-19 NOTE — Telephone Encounter (Signed)
Samantha [name] was late for her appointment with Dr.Hochrein today. I was unable to get her in with  Dr.Hocherin or the PA's this week or next.She was coming in for EKG f/u medication manament and Atrial fib.The daughter ask if we would give her a call Samantha [name].

## 2021-02-20 NOTE — Telephone Encounter (Signed)
Left message to call back  

## 2021-02-20 NOTE — Telephone Encounter (Signed)
Left message for pt to call.

## 2021-02-20 NOTE — Telephone Encounter (Signed)
Patient's daughter was returning call, she ask that you please call her and not her mother. She can be reach at [phone]

## 2021-02-21 NOTE — Telephone Encounter (Signed)
Called daughter to discuss upcoming appointment details.  Left call back number.

## 2021-02-22 NOTE — Progress Notes (Signed)
Cardiology Office Note   Date:  02/23/2021   ID:  Mialee, Weyman Oct 09, 1927, MRN [id number]  PCP:  Hadley Pen, MD  Cardiologist:   Rollene Rotunda, MD   Chief Complaint  Patient presents with  . Atrial Fibrillation      History of Present Illness: Samantha [name] is a 87 y.o. female who presents for ongoing assessment and management of atrial fib .  She had DCCV in 2019.   She had recurrent atrial fib.She is legally blind.  She lives in a retirement home.    She has caregivers most of the time.  She does walk around in her apartment.  He is not really noticing tachypalpitations.  She is not had any presyncope or syncope.  She does not have any chest pressure, neck or arm discomfort.  She does not have any PND or orthopnea.  She says she has some shortness of breath but her family says they never notice any of this unless she is walking up a ramp into her daughter's house.   Past Medical History:  Diagnosis Date  . Acute CHF (HCC)   . Hyperglycemia   . Hypertension   . Kidney stones, calcium oxalate   . Persistent atrial fibrillation with rapid ventricular response (HCC) 01/2018    Past Surgical History:  Procedure Laterality Date  . CARDIOVERSION N/A 03/02/2018   Procedure: CARDIOVERSION;  Surgeon: Wendall Stade, MD;  Location: [facility] ENDOSCOPY;  Service: Cardiovascular;  Laterality: N/A;     Current Outpatient Medications  Medication Sig Dispense Refill  . acetaminophen (TYLENOL) 500 MG tablet Take 500 mg by mouth every 6 (six) hours as needed for moderate pain or headache.     Marland Kitchen apixaban (ELIQUIS) 2.5 MG TABS tablet Take 1 tablet (2.5 mg total) by mouth 2 (two) times daily. 60 tablet 6  . carvedilol (COREG) 12.5 MG tablet Take 1.5 tablets (18.75 mg total) by mouth 2 (two) times daily. 270 tablet 3  . D-Mannose POWD Take 5 mLs by mouth daily. Mix in 8 oz water and drink    . furosemide (LASIX) 40 MG tablet Take 20 mg by mouth 2 (two) times daily.    .  hydrocortisone 2.5 % cream Apply 1 application topically 2 (two) times daily.     . hyoscyamine (LEVSIN SL) 0.125 MG SL tablet Take 0.125 mg by mouth every evening.   3  . latanoprost (XALATAN) 0.005 % ophthalmic solution instill 1 drop into each eye AT BEDTIME 2.5 mL 12  . magnesium oxide (MAG-OX) 400 MG tablet Take 1 tablet by mouth 2 (two) times daily.    . Multiple Vitamins-Minerals (ICAPS PO) Take 1 capsule by mouth 2 (two) times daily.    . Omega-3 Fatty Acids (FISH OIL) 1000 MG CAPS Take 1,000 mg by mouth daily.    . pantoprazole (PROTONIX) 40 MG tablet Take 40 mg by mouth daily.  1  . Polyethyl Glycol-Propyl Glycol (SYSTANE OP) Place 1 drop into both eyes 2 (two) times daily.    Marland Kitchen diltiazem (CARDIZEM CD) 180 MG 24 hr capsule Take 1 capsule (180 mg total) by mouth daily. 90 capsule 3  . losartan (COZAAR) 25 MG tablet Take 1 tablet (25 mg total) by mouth daily. 90 tablet 3   No current facility-administered medications for this visit.    Allergies:   Tape    ROS:  Please see the history of present illness.   Otherwise, review of systems are positive for  neuropathy.   All other systems are reviewed and negative.    PHYSICAL EXAM: VS:  BP 132/90   Pulse (!) 111   Ht 5\' 2"  (1.575 m)   Wt 121 lb 9.6 oz (55.2 kg)   LMP  (LMP Unknown)   BMI 22.24 kg/m  , BMI Body mass index is 22.24 kg/m. GEN:  No distress, frail  NECK:  No jugular venous distention at 90 degrees, waveform within normal limits, carotid upstroke brisk and symmetric, no bruits, no thyromegaly LYMPHATICS:  No cervical adenopathy LUNGS:  Clear to auscultation bilaterally BACK:  No CVA tenderness CHEST:  Unremarkable HEART:  S1 and S2 within normal limits, no S3, no clicks, no rubs, no murmurs, irregular ABD:  Positive bowel sounds normal in frequency in pitch, no bruits, no rebound, no guarding, unable to assess midline mass or bruit with the patient seated. EXT:  2 plus pulses throughout, moderate edema, no cyanosis  no clubbing SKIN:  No rashes no nodules NEURO:  Cranial nerves II through XII grossly intact, motor grossly intact throughout PSYCH:  Cognitively intact, oriented to person place and time   EKG:  EKG is  ordered today. Atrial fibrillation, rate 111, left axis deviation, QTC slightly prolonged, poor anterior R wave progression   Recent Labs: 06/01/2020: Hemoglobin 13.9; Platelets 138    Lipid Panel No results found for: CHOL, TRIG, HDL, CHOLHDL, VLDL, LDLCALC, LDLDIRECT    Wt Readings from Last 3 Encounters:  02/23/21 121 lb 9.6 oz (55.2 kg)  06/01/20 136 lb 12.8 oz (62.1 kg)  11/24/19 151 lb 9.6 oz (68.8 kg)      Other studies Reviewed: Additional studies/ records that were reviewed today include: None. Review of the above records demonstrates:  NA   ASSESSMENT AND PLAN:  CHRONIC ATRIAL FIB:    She has chronic atrial fibrillation the rate is slightly elevated.  I am going to increase her carvedilol to 18.75 mg twice daily.  Her family is to keep a chart of her blood pressures and heart rates.  She tolerates anticoagulation but her dose should be 2.5 mg Eliquis twice daily.   I will check a CBC today as the most recent labs did not include this.  CARDIOMYOPATHY: She seems to be euvolemic and I am going to manage this conservatively.  I am not planning any further imaging.  HTN: Her blood pressure is going to be treated in the context of managing her heart rate.    Current medicines are reviewed at length with the patient today.  The patient does not have concerns regarding medicines.  The following changes have been made: As above  Labs/ tests ordered today include:    Orders Placed This Encounter  Procedures  . CBC  . EKG 12-Lead     Disposition:   FU with me in 12 months  Signed, 01/22/20, MD  02/23/2021 9:52 AM    [facility]

## 2021-02-23 ENCOUNTER — Encounter: Payer: Self-pay | Admitting: Cardiology

## 2021-02-23 ENCOUNTER — Other Ambulatory Visit: Payer: Self-pay

## 2021-02-23 ENCOUNTER — Ambulatory Visit (INDEPENDENT_AMBULATORY_CARE_PROVIDER_SITE_OTHER): Payer: Medicare Other | Admitting: Cardiology

## 2021-02-23 VITALS — BP 132/90 | HR 111 | Ht 62.0 in | Wt 121.6 lb

## 2021-02-23 DIAGNOSIS — Z79899 Other long term (current) drug therapy: Secondary | ICD-10-CM

## 2021-02-23 DIAGNOSIS — I482 Chronic atrial fibrillation, unspecified: Secondary | ICD-10-CM

## 2021-02-23 DIAGNOSIS — I5022 Chronic systolic (congestive) heart failure: Secondary | ICD-10-CM | POA: Diagnosis not present

## 2021-02-23 DIAGNOSIS — I10 Essential (primary) hypertension: Secondary | ICD-10-CM

## 2021-02-23 MED ORDER — APIXABAN 2.5 MG PO TABS: 2.5000 mg | ORAL_TABLET | Freq: Two times a day (BID) | ORAL | 6 refills | Status: DC

## 2021-02-23 MED ORDER — CARVEDILOL 12.5 MG PO TABS: 18.7500 mg | ORAL_TABLET | Freq: Two times a day (BID) | ORAL | 3 refills | Status: AC

## 2021-02-23 NOTE — Patient Instructions (Addendum)
Medication Instructions:  INCREASE- Carvedilol 18.75 mg (1 1/2 tablets) by mouth twice a day DECREASE- Eliquis 2.5 mg by mouth twice a day  *If you need a refill on your cardiac medications before your next appointment, please call your pharmacy*   Lab Work: CBC Today  If you have labs (blood work) drawn today and your tests are completely normal, you will receive your results only by: Marland Kitchen MyChart Message (if you have MyChart) OR . A paper copy in the mail If you have any lab test that is abnormal or we need to change your treatment, we will call you to review the results.   Testing/Procedures: None Ordered   Follow-Up: At [facility], you and your health needs are our priority.  As part of our continuing mission to provide you with exceptional heart care, we have created designated Provider Care Teams.  These Care Teams include your primary Cardiologist (physician) and Advanced Practice Providers (APPs -  Physician Assistants and Nurse Practitioners) who all work together to provide you with the care you need, when you need it.  We recommend signing up for the patient portal called "MyChart".  Sign up information is provided on this After Visit Summary.  MyChart is used to connect with patients for Virtual Visits (Telemedicine).  Patients are able to view lab/test results, encounter notes, upcoming appointments, etc.  Non-urgent messages can be sent to your provider as well.   To learn more about what you can do with MyChart, go to [email or web address].    Your next appointment:   1 year(s)  The format for your next appointment:   In Person  Provider:   You may see Rollene Rotunda, MD or one of the following Advanced Practice Providers on your designated Care Team:    Theodore Demark, PA-C  Joni Reining, DNP, ANP

## 2021-02-24 LAB — CBC
Hematocrit: 40.4 % (ref 34.0–46.6)
Hemoglobin: 13.5 g/dL (ref 11.1–15.9)
MCH: 31.4 pg (ref 26.6–33.0)
MCHC: 33.4 g/dL (ref 31.5–35.7)
MCV: 94 fL (ref 79–97)
Platelets: 118 10*3/uL — ABNORMAL LOW (ref 150–450)
RBC: 4.3 x10E6/uL (ref 3.77–5.28)
RDW: 12.8 % (ref 11.7–15.4)
WBC: 7 10*3/uL (ref 3.4–10.8)

## 2021-03-02 NOTE — Telephone Encounter (Signed)
Patient seen in office 02/23/21

## 2021-06-28 ENCOUNTER — Ambulatory Visit: Payer: Medicare Other | Admitting: Cardiology

## 2021-09-30 ENCOUNTER — Other Ambulatory Visit (INDEPENDENT_AMBULATORY_CARE_PROVIDER_SITE_OTHER): Payer: Self-pay | Admitting: Ophthalmology

## 2021-11-07 ENCOUNTER — Other Ambulatory Visit: Payer: Self-pay | Admitting: Cardiology

## 2022-02-17 ENCOUNTER — Other Ambulatory Visit: Payer: Self-pay | Admitting: Cardiology

## 2022-02-18 NOTE — Telephone Encounter (Signed)
Prescription refill request for Eliquis received. ?Indication:Afib ?Last office visit:4/22 ?HCW:[facility] ?Age: 88 ?Weight:55.2 kg ? ?Prescription refilled ? ?

## 2022-03-24 DIAGNOSIS — I420 Dilated cardiomyopathy: Secondary | ICD-10-CM | POA: Insufficient documentation

## 2022-03-24 NOTE — Progress Notes (Signed)
?  ?Cardiology Office Note ? ? ?Date:  03/27/2022  ? ?ID:  Samantha [name], DOB 07/02/1927, MRN [id number] ? ?PCP:  Hadley Pen, MD  ?Cardiologist:   Rollene Rotunda, MD ? ? ?Chief Complaint  ?Patient presents with  ? Atrial Fibrillation  ? ? ?  ?History of Present Illness: ?Samantha [name] is a 88 y.o. female who presents for ongoing assessment and management of atrial fib .  She had DCCV in 2019.   She had recurrent atrial fib.She is legally blind.  She lives in a retirement home.   ? ?Since I last saw her she has done okay.  She lives by herself still in a retirement community and has an apartment.  Her meals are brought in.  She goes occasionally out and has a lot of people visiting her.  She has not had any new cardiovascular complaints.  She occasionally feels some palpitations but she has not had any presyncope or syncope.  She denies any new shortness of breath, PND or orthopnea. ? ? ?Past Medical History:  ?Diagnosis Date  ? Acute CHF (HCC)   ? Hyperglycemia   ? Hypertension   ? Kidney stones, calcium oxalate   ? Persistent atrial fibrillation with rapid ventricular response (HCC) 01/2018  ? ? ?Past Surgical History:  ?Procedure Laterality Date  ? CARDIOVERSION N/A 03/02/2018  ? Procedure: CARDIOVERSION;  Surgeon: Wendall Stade, MD;  Location: [facility] ENDOSCOPY;  Service: Cardiovascular;  Laterality: N/A;  ? ? ? ?Current Outpatient Medications  ?Medication Sig Dispense Refill  ? acetaminophen (TYLENOL) 500 MG tablet Take 500 mg by mouth every 6 (six) hours as needed for moderate pain or headache.     ? carvedilol (COREG) 12.5 MG tablet Take 1.5 tablets (18.75 mg total) by mouth 2 (two) times daily. 270 tablet 3  ? D-Mannose POWD Take 5 mLs by mouth daily. Mix in 8 oz water and drink    ? diltiazem (CARDIZEM CD) 180 MG 24 hr capsule Take 1 capsule (180 mg total) by mouth daily. 90 capsule 3  ? furosemide (LASIX) 40 MG tablet Take 20 mg by mouth 2 (two) times daily.    ? hydrocortisone 2.5 % cream Apply 1  application topically 2 (two) times daily.     ? hyoscyamine (LEVSIN SL) 0.125 MG SL tablet Take 0.125 mg by mouth every evening.   3  ? latanoprost (XALATAN) 0.005 % ophthalmic solution instill 1 drop into each eye AT BEDTIME 2.5 mL 12  ? losartan (COZAAR) 25 MG tablet Take 1 tablet (25 mg total) by mouth daily. 90 tablet 3  ? magnesium oxide (MAG-OX) 400 MG tablet Take 1 tablet by mouth 2 (two) times daily.    ? Multiple Vitamins-Minerals (ICAPS PO) Take 1 capsule by mouth 2 (two) times daily.    ? Omega-3 Fatty Acids (FISH OIL) 1000 MG CAPS Take 1,000 mg by mouth daily.    ? pantoprazole (PROTONIX) 40 MG tablet Take 40 mg by mouth daily.  1  ? Polyethyl Glycol-Propyl Glycol (SYSTANE OP) Place 1 drop into both eyes 2 (two) times daily.    ? apixaban (ELIQUIS) 2.5 MG TABS tablet Take 1 tablet (2.5 mg total) by mouth 2 (two) times daily. 60 tablet 5  ? ?No current facility-administered medications for this visit.  ? ? ?Allergies:   Tape  ? ? ?ROS:  Please see the history of present illness.   Otherwise, review of systems are positive for none.   All other  systems are reviewed and negative.  ? ? ?PHYSICAL EXAM: ?VS:  BP 122/76   Pulse 76   Ht 5\' 2"  (1.575 m)   Wt 115 lb 3.2 oz (52.3 kg)   LMP  (LMP Unknown)   SpO2 97%   BMI 21.07 kg/m?  , BMI Body mass index is 21.07 kg/m?. ?GEN:  No distress ?NECK:  No jugular venous distention at 90 degrees, waveform within normal limits, carotid upstroke brisk and symmetric, no bruits, no thyromegaly ?LYMPHATICS:  No cervical adenopathy ?LUNGS:  Clear to auscultation bilaterally ?BACK:  No CVA tenderness ?CHEST:  Unremarkable ?HEART:  S1 and S2 within normal limits, no S3,  no clicks, no rubs, no murmurs, irregular ?ABD:  Positive bowel sounds normal in frequency in pitch, no bruits, no rebound, no guarding, unable to assess midline mass or bruit with the patient seated. ?EXT:  2 plus pulses throughout, moderate edema, no cyanosis no clubbing ?SKIN:  No rashes no  nodules ?NEURO:  Cranial nerves II through XII grossly intact, motor grossly intact throughout ?PSYCH:  Cognitively intact, oriented to person place and time ? ? ? ? ?EKG:  EKG is  ordered today. ?Atrial fibrillation, rate 76, left axis deviation, QTC slightly prolonged, poor anterior R wave progression ? ? ?Recent Labs: ?03/26/2022: BUN 16; Creatinine, Ser 1.19; Hemoglobin 10.7; Platelets 152; Potassium 4.1; Sodium 145  ? ? ?Lipid Panel ?No results found for: CHOL, TRIG, HDL, CHOLHDL, VLDL, LDLCALC, LDLDIRECT ?  ? ?Wt Readings from Last 3 Encounters:  ?03/26/22 115 lb 3.2 oz (52.3 kg)  ?02/23/21 121 lb 9.6 oz (55.2 kg)  ?06/01/20 136 lb 12.8 oz (62.1 kg)  ?  ? ? ?Other studies Reviewed: ?Additional studies/ records that were reviewed today include: Labs. ?Review of the above records demonstrates: See elsewhere ? ? ?ASSESSMENT AND PLAN: ? ?CHRONIC ATRIAL FIB:    She tolerates rate control and anticoagulation.  I will check a CBC and a basic metabolic profile today. Samantha [name] has a CHA2DS2 - VASc score of 5.  Otherwise no change in therapy. ? ?CARDIOMYOPATHY:   She seems to be euvolemic.  We are managing this conservatively with no plans for further imaging. ? ?HTN: Her blood pressure is controlled on current meds.  No change in therapy.  ? ?Current medicines are reviewed at length with the patient today.  The patient does not have concerns regarding medicines. ? ?The following changes have been made: As above ? ?Labs/ tests ordered today include:   ? ?Orders Placed This Encounter  ?Procedures  ? Basic metabolic panel  ? CBC  ? EKG 12-Lead  ? ? ? ?Disposition:   FU with me in 12 months ? ?Signed, ?Ron Agee, MD  ?03/27/2022 9:34 AM    ?[facility] ? ?

## 2022-03-26 ENCOUNTER — Other Ambulatory Visit: Payer: Self-pay | Admitting: Cardiology

## 2022-03-26 ENCOUNTER — Encounter: Payer: Self-pay | Admitting: Cardiology

## 2022-03-26 ENCOUNTER — Ambulatory Visit (INDEPENDENT_AMBULATORY_CARE_PROVIDER_SITE_OTHER): Payer: Medicare Other | Admitting: Cardiology

## 2022-03-26 VITALS — BP 122/76 | HR 76 | Ht 62.0 in | Wt 115.2 lb

## 2022-03-26 DIAGNOSIS — I420 Dilated cardiomyopathy: Secondary | ICD-10-CM

## 2022-03-26 DIAGNOSIS — I10 Essential (primary) hypertension: Secondary | ICD-10-CM

## 2022-03-26 DIAGNOSIS — I4821 Permanent atrial fibrillation: Secondary | ICD-10-CM

## 2022-03-26 NOTE — Telephone Encounter (Signed)
Pt last saw Dr Antoine Poche 03/26/22, last labs 03/26/22 results are not back yet. Age 88, weight 52.3kg, based on specified criteria pt is on appropriate dosage of Eliquis 2.5mg  BID for afib.  Will refill rx once lab results back and reviewed.   ?

## 2022-03-26 NOTE — Patient Instructions (Signed)
Medication Instructions:  ?No Changes In Medications at this time.  ?*If you need a refill on your cardiac medications before your next appointment, please call your pharmacy* ? ?Lab Work: ?LABS TODAY  ?If you have labs (blood work) drawn today and your tests are completely normal, you will receive your results only by: ?MyChart Message (if you have MyChart) OR ?A paper copy in the mail ?If you have any lab test that is abnormal or we need to change your treatment, we will call you to review the results. ? ?Follow-Up: ?At [facility], you and your health needs are our priority.  As part of our continuing mission to provide you with exceptional heart care, we have created designated Provider Care Teams.  These Care Teams include your primary Cardiologist (physician) and Advanced Practice Providers (APPs -  Physician Assistants and Nurse Practitioners) who all work together to provide you with the care you need, when you need it. ? ?We recommend signing up for the patient portal called "MyChart".  Sign up information is provided on this After Visit Summary.  MyChart is used to connect with patients for Virtual Visits (Telemedicine).  Patients are able to view lab/test results, encounter notes, upcoming appointments, etc.  Non-urgent messages can be sent to your provider as well.   ?To learn more about what you can do with MyChart, go to [email or web address].   ? ?Your next appointment:   ?1 year(s) ? ?The format for your next appointment:   ?In Person ? ?Provider:   ?Rollene Rotunda, MD   ? ? ? ? ? ?  ?

## 2022-03-27 ENCOUNTER — Other Ambulatory Visit: Payer: Self-pay

## 2022-03-27 ENCOUNTER — Encounter: Payer: Self-pay | Admitting: *Deleted

## 2022-03-27 ENCOUNTER — Encounter: Payer: Self-pay | Admitting: Cardiology

## 2022-03-27 LAB — BASIC METABOLIC PANEL
BUN/Creatinine Ratio: 13 (ref 12–28)
BUN: 16 mg/dL (ref 10–36)
CO2: 28 mmol/L (ref 20–29)
Calcium: 9.3 mg/dL (ref 8.7–10.3)
Chloride: 106 mmol/L (ref 96–106)
Creatinine, Ser: 1.19 mg/dL — ABNORMAL HIGH (ref 0.57–1.00)
Glucose: 101 mg/dL — ABNORMAL HIGH (ref 70–99)
Potassium: 4.1 mmol/L (ref 3.5–5.2)
Sodium: 145 mmol/L — ABNORMAL HIGH (ref 134–144)
eGFR: 43 mL/min/{1.73_m2} — ABNORMAL LOW (ref >59)

## 2022-03-27 LAB — CBC
Hematocrit: 32.3 % — ABNORMAL LOW (ref 34.0–46.6)
Hemoglobin: 10.7 g/dL — ABNORMAL LOW (ref 11.1–15.9)
MCH: 29.9 pg (ref 26.6–33.0)
MCHC: 33.1 g/dL (ref 31.5–35.7)
MCV: 90 fL (ref 79–97)
Platelets: 152 10*3/uL (ref 150–450)
RBC: 3.58 x10E6/uL — ABNORMAL LOW (ref 3.77–5.28)
RDW: 12.5 % (ref 11.7–15.4)
WBC: 5.8 10*3/uL (ref 3.4–10.8)

## 2022-03-27 MED ORDER — APIXABAN 2.5 MG PO TABS: 2.5000 mg | ORAL_TABLET | Freq: Two times a day (BID) | ORAL | 5 refills | Status: AC
